# Patient Record
Sex: Male | Born: 1965 | Race: Black or African American | Hispanic: No | Marital: Single | Smoking: Never smoker
Health system: Southern US, Community
[De-identification: ages and names within clinical notes are randomized; demographics above are authoritative.]

## PROBLEM LIST (undated history)

## (undated) DIAGNOSIS — N4400 Torsion of testis, unspecified: Secondary | ICD-10-CM

## (undated) DIAGNOSIS — T7840XA Allergy, unspecified, initial encounter: Secondary | ICD-10-CM

## (undated) HISTORY — PX: KNEE SURGERY: SHX244

## (undated) HISTORY — PX: SHOULDER SURGERY: SHX246

## (undated) HISTORY — DX: Allergy, unspecified, initial encounter: T78.40XA

## (undated) HISTORY — DX: Torsion of testis, unspecified: N44.00

---

## 1999-08-25 ENCOUNTER — Emergency Department (HOSPITAL_COMMUNITY): Admission: EM | Admit: 1999-08-25 | Discharge: 1999-08-25 | Payer: Self-pay | Admitting: Podiatry

## 2002-09-16 ENCOUNTER — Emergency Department (HOSPITAL_COMMUNITY): Admission: EM | Admit: 2002-09-16 | Discharge: 2002-09-16 | Payer: Self-pay | Admitting: Emergency Medicine

## 2002-09-28 ENCOUNTER — Emergency Department (HOSPITAL_COMMUNITY): Admission: EM | Admit: 2002-09-28 | Discharge: 2002-09-28 | Payer: Self-pay | Admitting: Emergency Medicine

## 2007-04-14 ENCOUNTER — Encounter: Admission: RE | Admit: 2007-04-14 | Discharge: 2007-04-14 | Payer: Self-pay | Admitting: Occupational Medicine

## 2008-10-27 ENCOUNTER — Inpatient Hospital Stay (HOSPITAL_COMMUNITY): Admission: EM | Admit: 2008-10-27 | Discharge: 2008-10-29 | Payer: Self-pay | Admitting: Emergency Medicine

## 2008-10-28 ENCOUNTER — Encounter (INDEPENDENT_AMBULATORY_CARE_PROVIDER_SITE_OTHER): Payer: Self-pay | Admitting: *Deleted

## 2010-03-19 ENCOUNTER — Emergency Department (HOSPITAL_COMMUNITY): Admission: EM | Admit: 2010-03-19 | Discharge: 2010-03-19 | Payer: Self-pay | Admitting: Emergency Medicine

## 2011-01-08 LAB — URINALYSIS, ROUTINE W REFLEX MICROSCOPIC
Bilirubin Urine: NEGATIVE
Glucose, UA: NEGATIVE mg/dL
Hgb urine dipstick: NEGATIVE
Ketones, ur: NEGATIVE mg/dL
Nitrite: NEGATIVE
Protein, ur: NEGATIVE mg/dL
Specific Gravity, Urine: 1.044 — ABNORMAL HIGH (ref 1.005–1.030)
Urobilinogen, UA: 1 mg/dL (ref 0.0–1.0)
pH: 7.5 (ref 5.0–8.0)

## 2011-01-08 LAB — BASIC METABOLIC PANEL
BUN: 11 mg/dL (ref 6–23)
CO2: 26 mEq/L (ref 19–32)
Calcium: 8.6 mg/dL (ref 8.4–10.5)
Chloride: 104 mEq/L (ref 96–112)
Creatinine, Ser: 0.89 mg/dL (ref 0.4–1.5)
GFR calc Af Amer: >60 mL/min (ref >60)
GFR calc non Af Amer: >60 mL/min (ref >60)
Glucose, Bld: 88 mg/dL (ref 70–99)
Potassium: 3.9 mEq/L (ref 3.5–5.1)
Sodium: 136 mEq/L (ref 135–145)

## 2011-01-08 LAB — BRAIN NATRIURETIC PEPTIDE: Pro B Natriuretic peptide (BNP): <30 pg/mL (ref 0.0–100.0)

## 2011-01-08 LAB — CBC
HCT: 44.8 % (ref 39.0–52.0)
HCT: 44.6 % (ref 39.0–52.0)
Hemoglobin: 15.4 g/dL (ref 13.0–17.0)
Hemoglobin: 15.5 g/dL (ref 13.0–17.0)
MCHC: 34.4 g/dL (ref 30.0–36.0)
MCHC: 34.7 g/dL (ref 30.0–36.0)
MCV: 91.6 fL (ref 78.0–100.0)
MCV: 91.7 fL (ref 78.0–100.0)
Platelets: 197 10*3/uL (ref 150–400)
Platelets: 186 10*3/uL (ref 150–400)
RBC: 4.9 MIL/uL (ref 4.22–5.81)
RBC: 4.87 MIL/uL (ref 4.22–5.81)
RDW: 14.4 % (ref 11.5–15.5)
RDW: 13.4 % (ref 11.5–15.5)
WBC: 4.3 10*3/uL (ref 4.0–10.5)
WBC: 5.1 10*3/uL (ref 4.0–10.5)

## 2011-01-08 LAB — DIFFERENTIAL
Basophils Absolute: 0 10*3/uL (ref 0.0–0.1)
Basophils Relative: 0 % (ref 0–1)
Eosinophils Absolute: 0.1 10*3/uL (ref 0.0–0.7)
Eosinophils Relative: 2 % (ref 0–5)
Lymphocytes Relative: 47 % — ABNORMAL HIGH (ref 12–46)
Lymphs Abs: 2 10*3/uL (ref 0.7–4.0)
Monocytes Absolute: 0.4 10*3/uL (ref 0.1–1.0)
Monocytes Relative: 9 % (ref 3–12)
Neutro Abs: 1.8 10*3/uL (ref 1.7–7.7)
Neutrophils Relative %: 42 % — ABNORMAL LOW (ref 43–77)

## 2011-01-08 LAB — POCT I-STAT 3, ART BLOOD GAS (G3+)
Acid-base deficit: 1 mmol/L (ref 0.0–2.0)
Bicarbonate: 24.9 mEq/L — ABNORMAL HIGH (ref 20.0–24.0)
O2 Saturation: 96 %
TCO2: 26 mmol/L (ref 0–100)
pCO2 arterial: 44.2 mmHg (ref 35.0–45.0)
pH, Arterial: 7.358 (ref 7.350–7.450)
pO2, Arterial: 87 mmHg (ref 80.0–100.0)

## 2011-01-08 LAB — DRUGS OF ABUSE SCREEN W/O ALC, ROUTINE URINE
Amphetamine Screen, Ur: NEGATIVE
Barbiturate Quant, Ur: NEGATIVE
Benzodiazepines.: NEGATIVE
Cocaine Metabolites: NEGATIVE
Creatinine,U: 196.1 mg/dL
Marijuana Metabolite: NEGATIVE
Methadone: NEGATIVE
Opiate Screen, Urine: NEGATIVE
Phencyclidine (PCP): NEGATIVE
Propoxyphene: NEGATIVE

## 2011-01-08 LAB — CK TOTAL AND CKMB (NOT AT ARMC)
CK, MB: 2.4 ng/mL (ref 0.3–4.0)
CK, MB: 2.1 ng/mL (ref 0.3–4.0)
Relative Index: 1.1 (ref 0.0–2.5)
Relative Index: 1.2 (ref 0.0–2.5)
Total CK: 214 U/L (ref 7–232)
Total CK: 169 U/L (ref 7–232)

## 2011-01-08 LAB — COMPREHENSIVE METABOLIC PANEL
ALT: 31 U/L (ref 0–53)
AST: 31 U/L (ref 0–37)
Albumin: 3.7 g/dL (ref 3.5–5.2)
Alkaline Phosphatase: 50 U/L (ref 39–117)
BUN: 10 mg/dL (ref 6–23)
CO2: 24 mEq/L (ref 19–32)
Calcium: 8.9 mg/dL (ref 8.4–10.5)
Chloride: 104 mEq/L (ref 96–112)
Creatinine, Ser: 0.81 mg/dL (ref 0.4–1.5)
GFR calc Af Amer: >60 mL/min (ref >60)
GFR calc non Af Amer: >60 mL/min (ref >60)
Glucose, Bld: 95 mg/dL (ref 70–99)
Potassium: 3.3 mEq/L — ABNORMAL LOW (ref 3.5–5.1)
Sodium: 134 mEq/L — ABNORMAL LOW (ref 135–145)
Total Bilirubin: 1.1 mg/dL (ref 0.3–1.2)
Total Protein: 6.8 g/dL (ref 6.0–8.3)

## 2011-01-08 LAB — POCT I-STAT 3, VENOUS BLOOD GAS (G3P V)
Acid-base deficit: 4 mmol/L — ABNORMAL HIGH (ref 0.0–2.0)
Bicarbonate: 23.1 mEq/L (ref 20.0–24.0)
O2 Saturation: 69 %
TCO2: 25 mmol/L (ref 0–100)
pCO2, Ven: 48.4 mmHg (ref 45.0–50.0)
pH, Ven: 7.286 (ref 7.250–7.300)
pO2, Ven: 41 mmHg (ref 30.0–45.0)

## 2011-01-08 LAB — MAGNESIUM: Magnesium: 2 mg/dL (ref 1.5–2.5)

## 2011-01-08 LAB — POCT I-STAT, CHEM 8
BUN: 14 mg/dL (ref 6–23)
Calcium, Ion: 1.24 mmol/L (ref 1.12–1.32)
Chloride: 104 mEq/L (ref 96–112)
Creatinine, Ser: 0.9 mg/dL (ref 0.4–1.5)
Glucose, Bld: 88 mg/dL (ref 70–99)
HCT: 45 % (ref 39.0–52.0)
Hemoglobin: 15.3 g/dL (ref 13.0–17.0)
Potassium: 4.1 mEq/L (ref 3.5–5.1)
Sodium: 141 mEq/L (ref 135–145)
TCO2: 27 mmol/L (ref 0–100)

## 2011-01-08 LAB — CULTURE, BLOOD (ROUTINE X 2)
Culture: NO GROWTH
Culture: NO GROWTH

## 2011-01-08 LAB — POCT CARDIAC MARKERS
CKMB, poc: 1.1 ng/mL (ref 1.0–8.0)
CKMB, poc: 2.4 ng/mL (ref 1.0–8.0)
Myoglobin, poc: 47.2 ng/mL (ref 12–200)
Myoglobin, poc: 75 ng/mL (ref 12–200)
Troponin i, poc: <0.05 ng/mL (ref 0.00–0.09)
Troponin i, poc: <0.05 ng/mL (ref 0.00–0.09)

## 2011-01-08 LAB — URINE CULTURE
Colony Count: NO GROWTH
Culture: NO GROWTH

## 2011-01-08 LAB — TROPONIN I
Troponin I: 0.09 ng/mL — ABNORMAL HIGH (ref 0.00–0.06)
Troponin I: 0.09 ng/mL — ABNORMAL HIGH (ref 0.00–0.06)
Troponin I: 0.1 ng/mL — ABNORMAL HIGH (ref 0.00–0.06)

## 2011-01-08 LAB — HEMOGLOBIN A1C
Hgb A1c MFr Bld: 5.2 % (ref 4.6–6.1)
Mean Plasma Glucose: 103 mg/dL

## 2011-01-08 LAB — PROTIME-INR
INR: 1.1 (ref 0.00–1.49)
Prothrombin Time: 14.1 seconds (ref 11.6–15.2)

## 2011-01-08 LAB — LIPID PANEL
Cholesterol: 131 mg/dL (ref 0–200)
HDL: 37 mg/dL — ABNORMAL LOW (ref >39)
LDL Cholesterol: 82 mg/dL (ref 0–99)
Total CHOL/HDL Ratio: 3.5 RATIO
Triglycerides: 60 mg/dL (ref <150)
VLDL: 12 mg/dL (ref 0–40)

## 2011-01-08 LAB — APTT: aPTT: 33 seconds (ref 24–37)

## 2011-01-08 LAB — HOMOCYSTEINE: Homocysteine: 7.4 umol/L (ref 4.0–15.4)

## 2011-01-08 LAB — TSH: TSH: 1.977 u[IU]/mL (ref 0.350–4.500)

## 2011-01-08 LAB — PHOSPHORUS: Phosphorus: 3.1 mg/dL (ref 2.3–4.6)

## 2011-02-05 NOTE — Discharge Summary (Signed)
Andrew Jacobs, Andrew Jacobs          ACCOUNT NO.:  [id number]   MEDICAL RECORD NO.:  [id number]          PATIENT TYPE:  INP   LOCATION:  2006                         FACILITY:  [facility]   PHYSICIAN:  Monte Fantasia, MD  DATE OF BIRTH:  1966/04/05   DATE OF ADMISSION:  10/27/2008  DATE OF DISCHARGE:  10/29/2008                               DISCHARGE SUMMARY   DISCHARGE DIAGNOSES:  1. Precordial chest pain.  2. Cardiomyopathy.  3. Status post cardiac catheterization.  4. Atypical pneumonia.   MEDICATIONS ON DISCHARGE:  1. Aspirin 325 mg p.o. daily.  2. Vasotec 5 mg p.o. daily.  3. Zithromax 500 mg p.o. daily for 2 days.  4. Plavix 75 mg p.o. daily.  5. Protonix 40 mg p.o. q.12 h.   Cardiology consultation with Dr. Algie Coffer.   PROCEDURES DONE DURING THE STAY IN THE HOSPITAL:  Cardiac  catheterization, done on October 28, 2008.   IMPRESSION:  Normal coronaries with mild to moderate left ventricular  systolic dysfunction.   COURSE DURING THE HOSPITAL STAY:  A 45 year old African American male  patient was admitted on October 27, 2008, with complaints of chest pain,  which lasted for 20-30 minutes.  The patient states that the chest pain  was felt in the substernal region, more like tightness of chest but not  associated with nausea, vomiting, abdominal pain, dizziness, or  diaphoresis.  The patient had cardiac enzymes done, which were positive  with troponins of 0.09 and 0.1.  The patient had a Cardiology evaluation  regarding the same with Dr. Algie Coffer.  The patient had a 2-D echo done,  which showed mild to moderate left LV systolic dysfunction with an EF of  40%.  The patient underwent a coronary artery catheterization.  The patient denies any chest complaints at present, and as per  Cardiology, also the patient is stable to be discharged.  The patient is  recommended to follow up with Dr. Algie Coffer in the next few weeks, with in  1 or 2 weeks.   RADIOLOGICAL INVESTIGATIONS  DONE DURING THE STAY IN THE HOSPITAL:  1. Chest x-ray done on October 27, 2008.  Impression:  Left lower lobe infiltrate, likely due to the pneumonia.  1. CT angio of the chest, done on October 27, 2008.  Impression:  Unremarkable examination.  No evidence of pulmonary emboli  or thoracic aortic aneurysm.   LABORATORY DATA UPON DISCHARGE:  Total WBC 5.1, hemoglobin 15.5,  hematocrit 44.6, platelets of 186.  Sodium 136, potassium 3.9, chloride  104, bicarb 26, glucose 88, BUN 11, creatinine 0.89, calcium of 8.6.  Cardiac enzymes, troponin x3 sets were 0.09, 0.09, and 0.1.  Total  cholesterol 131, triglycerides 60, HDL 37, LDL 82.  Homocysteine level  was 7.4.  UA has been negative.  Blood cultures and urine cultures have  been no growth to date.   DISPOSITION:  The patient is stable to be discharged home, is counseled  to avoid lifting heavy weights, and recommended mild activity and  gradually increase in his activity and also recommended to report to the  ER in case of any further chest pains or  diaphoresis.  The patient is  recommended also to follow up with Dr. Algie Coffer in the next 1 or 2 weeks.  The patient is stable to be discharged at present and can be discharged  home.   Primary care physician is unassigned.      Monte Fantasia, MD  Electronically Signed     MP/MEDQ  D:  10/29/2008  T:  10/29/2008  Job:  [id number]   cc:   Ricki Rodriguez, M.D.

## 2011-02-05 NOTE — H&P (Signed)
Andrew Jacobs, Andrew Jacobs          ACCOUNT NO.:  [id number]   MEDICAL RECORD NO.:  [id number]          PATIENT TYPE:  INP   LOCATION:  2006                         FACILITY:  [facility]   PHYSICIAN:  Carlena Hurl, MDDATE OF BIRTH:  October 18, 1965   DATE OF ADMISSION:  10/27/2008  DATE OF DISCHARGE:                              HISTORY & PHYSICAL   CHIEF COMPLAINT:  Chest pain of 1-day duration.   HISTORY OF PRESENT ILLNESS:  This is a 45 year old African American  gentleman who has no past medical history, coming to the ER with a 1-day  history of chest pain.  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  He states that he  went to the work this morning, and on the afternoon, he was sitting on  his desk and noticed chest pain in the substernal region which he felt  like a tightness in the chest, but no associated nausea, no vomiting, no  abdominal pain, no dizziness, no diaphoresis.  He states that he had  that chest pain for about 20 to 30 minutes, and after that the patient  walked around in the office and he still had little bit of chest pain  and later he took some rest, and finally after sometime, the chest pain  resolved as his colleagues wanted him to come the ER, the patient  decided to go to the ER.  So, after the patient came to the ER, he was  given aspirin and nitroglycerin.  He states that his chest pain resolved  and he had EKG done in the ER, which is normal and with normal sinus  rhythm.   PAST MEDICAL HISTORY:  Nil.   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   MEDICATIONS:  None.   FAMILY HISTORY:  Nothing significant.   ALLERGIES:  No known drug allergies.   SOCIAL HISTORY:  The patient lives with his wife at home  and he denies  smoking, alcohol, IV drug abuse and he is a Emergency planning/management officer works in  [employer] for the past 18 years.   REVIEW OF SYSTEM:  Other than that was in history of present illness,  nothing significant.   PHYSICAL EXAMINATION:  GENERAL:  This is a general 45 year old African  American gentleman who is lying comfortably on the bed without any  severe chest pain or shortness of breath.  VITALS:  His blood pressure when he came into the ER are 123/90, pulse  rate of 62, respirations 16, later his blood pressure came down to  118/65, pulse rate of 54, respirations 14, temperature 98.1.  HEENT:  Head is atraumatic, normocephalic.  Pupils, PERRLA.  Tympanic  membrane intact.  No discharge from eyes or ears.  NECK:  Supple.  No JVD noted and no goiter appreciated.  LUNGS:  Clear to auscultation bilaterally.  CVS:  S1 and S2  heard with a regular rate and rhythm.  ABDOMEN:  Soft.  Bowel sounds present.  Nontender.  Nondistended.  EXTREMITIES:  No pedal edema noted.  Pulses are palpable bilaterally.   LABORATORY DATA:  Labs for this patient revealed hemoglobin of 15.3,  hematocrit 45.0.  Sodium 141, potassium 4.1, chloride 104, bicarb of 27,  calcium of 1.24, BUN 14, creatinine 0.9.  First set of CK-MB 2.4, second  set 1.1.  First set of troponin I less than 0.05, second set less than  0.05.  Myoglobin is 47.2; first set and second set is 75.0.  EKG showed  normal sinus rhythm, and the patient had chest x-ray which showed left  lower lobe infiltrate likely pneumonia and CT chest was done, which is  negative for any pulmonary embolism and lungs are clear with no  infiltrate.   ASSESSMENT AND PLAN:  This is a 45 year old African American gentleman  who works as a Emergency planning/management officer now coming in with 1-day history of  substernal chest pain, no associated nausea, vomiting, diaphoresis.  1. 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      We will admit him to rule out  cardiac chest pain.  So far his EKG      is normal and two sets of cardiac enzymes are negative.  So, once      the patient is clinically stable and if the third set of enzyme is      also negative and there is no worsening of his chest pain, the      patient can be discharged home to get his stress test as an      outpatient and we will also check his lipid profile.  2. Deep vein thrombosis prophylaxis.  The patient will be started on      Lovenox 40 mg subcu 1 time a day.   DIET:  The patient will be given regular diet as tolerated.   DISPOSITION:  To home, once the patient is clinically stable.      Carlena Hurl, MD  Electronically Signed     JD/MEDQ  D:  10/27/2008  T:  10/28/2008  Job:  [id number]

## 2012-02-21 ENCOUNTER — Other Ambulatory Visit (HOSPITAL_COMMUNITY): Payer: Self-pay | Admitting: Family Medicine

## 2012-02-21 ENCOUNTER — Ambulatory Visit (HOSPITAL_COMMUNITY)
Admission: RE | Admit: 2012-02-21 | Discharge: 2012-02-21 | Disposition: A | Discharge status: Home / Self Care | Payer: Self-pay | Source: Ambulatory Visit | Attending: Family Medicine | Admitting: Family Medicine

## 2012-02-21 DIAGNOSIS — R51 Headache: Secondary | ICD-10-CM | POA: Insufficient documentation

## 2012-02-21 DIAGNOSIS — G8929 Other chronic pain: Secondary | ICD-10-CM

## 2012-12-17 ENCOUNTER — Other Ambulatory Visit: Payer: Self-pay | Admitting: Neurology

## 2012-12-17 DIAGNOSIS — T1490XA Injury, unspecified, initial encounter: Secondary | ICD-10-CM

## 2012-12-17 DIAGNOSIS — R519 Headache, unspecified: Secondary | ICD-10-CM

## 2012-12-23 ENCOUNTER — Ambulatory Visit
Admission: RE | Admit: 2012-12-23 | Discharge: 2012-12-23 | Disposition: A | Discharge status: Home / Self Care | Payer: Self-pay | Source: Ambulatory Visit | Attending: Neurology | Admitting: Neurology

## 2012-12-23 DIAGNOSIS — R519 Headache, unspecified: Secondary | ICD-10-CM

## 2012-12-23 DIAGNOSIS — T1490XA Injury, unspecified, initial encounter: Secondary | ICD-10-CM

## 2012-12-23 MED ORDER — GADOBENATE DIMEGLUMINE 529 MG/ML IV SOLN
18.0000 mL | Freq: Once | INTRAVENOUS | Status: AC | PRN
  Administered 2012-12-23: 18 mL via INTRAVENOUS

## 2015-01-05 ENCOUNTER — Ambulatory Visit (INDEPENDENT_AMBULATORY_CARE_PROVIDER_SITE_OTHER): Payer: 59 | Admitting: Physician Assistant

## 2015-01-05 VITALS — BP 104/72 | HR 73 | Temp 98.2°F | Resp 12 | Ht 71.25 in | Wt 191.0 lb

## 2015-01-05 DIAGNOSIS — J302 Other seasonal allergic rhinitis: Secondary | ICD-10-CM

## 2015-01-05 DIAGNOSIS — R05 Cough: Secondary | ICD-10-CM | POA: Diagnosis not present

## 2015-01-05 DIAGNOSIS — R059 Cough, unspecified: Secondary | ICD-10-CM

## 2015-01-05 MED ORDER — HYDROCODONE-HOMATROPINE 5-1.5 MG/5ML PO SYRP
5.0000 mL | ORAL_SOLUTION | Freq: Three times a day (TID) | ORAL | Status: DC | PRN
Start: 2015-01-05 — End: 2015-10-09

## 2015-01-05 MED ORDER — LORATADINE-PSEUDOEPHEDRINE ER 10-240 MG PO TB24: 1.0000 | ORAL_TABLET | Freq: Every day | ORAL | Status: DC

## 2015-01-05 NOTE — Progress Notes (Signed)
01/05/2015 at 4:09 PM  Andrew Jacobs / DOB: May 21, 1966 / MRN: [id number]  The patient  does not have a problem list on file.  SUBJECTIVE  Chief complaint: Allergies; Headache; and Cough  HPI  Andrew Jacobs is a well appearing 49 y.o. male here today for the evaluation of seasonal allergies.  He reports sneezing, nasal itching, eye irritation and mild chest congestion for 7 days now. He reports night time cough that has been keeping him awake at night. He has been taking Claritin for his symptoms with mild relief. He has tried using flonase once through this exacerbation.   He feels his allergies have been getting worse as he ages, and has a difficult time controlling them.  Going outside makes his symptoms so bad that he can no longer enjoy outdoor activities.   He  has a past medical history of Allergy.    He currently has no medications in their medication list.  Andrew Jacobs has No Known Allergies. He  reports that he has never smoked. He has never used smokeless tobacco. He  has no sexual activity history on file. The patient  has past surgical history that includes Knee surgery and Shoulder surgery.  His family history is not on file.  Review of Systems  Constitutional: Negative for fever and chills.  HENT: Negative for hearing loss and sore throat.   Eyes: Negative for double vision and photophobia.  Respiratory: Positive for cough. Negative for sputum production and shortness of breath.   Gastrointestinal: Negative for nausea and abdominal pain.  Skin: Negative for rash.  Neurological: Negative for dizziness.    OBJECTIVE  His  height is 5' 11.25" (1.81 m) and weight is 191 lb (86.637 kg). His oral temperature is 98.2 F (36.8 C). His blood pressure is 104/72 and his pulse is 73. His respiration is 12 and oxygen saturation is 97%.  The patient's body mass index is 26.45 kg/(m^2).  Physical Exam  Constitutional: He is oriented to person, place, and time. He  appears well-developed and well-nourished. No distress.  HENT:  Right Ear: Hearing, tympanic membrane, external ear and ear canal normal.  Left Ear: Hearing, tympanic membrane, external ear and ear canal normal.  Nose: Mucosal edema (bluish hue) present. No sinus tenderness. Right sinus exhibits no maxillary sinus tenderness and no frontal sinus tenderness. Left sinus exhibits no maxillary sinus tenderness and no frontal sinus tenderness.  Mouth/Throat: Uvula is midline and mucous membranes are normal. Mucous membranes are not pale, not dry and not cyanotic. No oropharyngeal exudate, posterior oropharyngeal edema, posterior oropharyngeal erythema or tonsillar abscesses.  Cardiovascular: Normal rate and regular rhythm.   Respiratory: Effort normal and breath sounds normal. He has no wheezes. He has no rales.  Musculoskeletal: Normal range of motion.  Lymphadenopathy:    He has no cervical adenopathy.  Neurological: He is alert and oriented to person, place, and time.  Skin: Skin is warm and dry. He is not diaphoretic.  Psychiatric: He has a normal mood and affect.    No results found for this or any previous visit (from the past 24 hour(s)).  ASSESSMENT & PLAN  Andrew Jacobs was seen today for allergies, headache and cough.  Diagnoses and all orders for this visit:  Seasonal allergies: Advised patient via avs to start flonase today along with the below, and once his symptoms are controlled to start zyrtec daily. Advised that if this regimen fails to control his symptoms then to call back and I will  write for a steroid taper.  Orders: -     loratadine-pseudoephedrine (CLARITIN-D 24 HOUR) 10-240 MG per 24 hr tablet; Take 1 tablet by mouth daily. -     Ambulatory referral to Allergy  Cough Orders: -     HYDROcodone-homatropine (HYCODAN) 5-1.5 MG/5ML syrup; Take 5 mLs by mouth every 8 (eight) hours as needed for cough.   The patient was advised to call or come back to clinic if he does not see  an improvement in symptoms, or worsens with the above plan.   Andrew Jacobs, MHS, PA-C [facility] 01/05/2015 4:09 PM

## 2015-01-05 NOTE — Patient Instructions (Signed)
Take Zyrtec and Flonase for seasonal allergies in the future.  Add Claritin D to this regimen if your symptoms are not controlled with this combination.

## 2015-03-16 ENCOUNTER — Institutional Professional Consult (permissible substitution): Payer: Self-pay | Admitting: Internal Medicine

## 2015-10-06 ENCOUNTER — Telehealth: Payer: Self-pay

## 2015-10-06 NOTE — Telephone Encounter (Signed)
Message for Dr. Cleta Albertsaub, pt has paperwork from [employer] that he would like to talk about  Please advise  [phone]

## 2015-10-09 ENCOUNTER — Ambulatory Visit (INDEPENDENT_AMBULATORY_CARE_PROVIDER_SITE_OTHER): Payer: Commercial Managed Care - HMO | Admitting: Family

## 2015-10-09 ENCOUNTER — Encounter: Payer: Self-pay | Admitting: Family

## 2015-10-09 VITALS — BP 144/94 | HR 80 | Temp 98.5°F | Resp 18 | Ht 71.25 in | Wt 204.4 lb

## 2015-10-09 DIAGNOSIS — N5239 Other post-surgical erectile dysfunction: Secondary | ICD-10-CM | POA: Diagnosis not present

## 2015-10-09 DIAGNOSIS — R32 Unspecified urinary incontinence: Secondary | ICD-10-CM

## 2015-10-09 DIAGNOSIS — N529 Male erectile dysfunction, unspecified: Secondary | ICD-10-CM | POA: Insufficient documentation

## 2015-10-09 NOTE — Progress Notes (Signed)
Subjective:    Patient ID: Andrew Jacobs, male    DOB: [birth year], 50 y.o.   MRN: [id number]  Chief Complaint  Patient presents with  . Establish Care    had surgery when he was in the [employer] and has paper work concerning it    HPI:  Andrew Jacobs is a 50 y.o. male who  has a past medical history of Allergy and Testicular torsion. and presents today for an office visit to establish care.  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 He continued to have residual problems including pain which complicated strenuous activity. He experienced a second episode of the testicular torsion while he was seated at his home experiencing a siginifcant amount of pain in January 1994. 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 Since the second time he occasionally will have symptoms of discomfort. He has also experienced urinary incontinence and erectile dysfunction since the initial episode of the testicular torsion and continues today and has been going on for nearly 25 years. Modifying factors include urinary incontinence pads which only help to absorb but do not improve the symptoms. Severity is enough that he goes to bathroom greater than 5 times at night and practically hourly. He is currently awaiting follow up with urology for this. 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 He is also requesting paperwork to be filled in for the [employer].   No Known Allergies   Outpatient Prescriptions Prior to Visit  Medication Sig Dispense Refill  . HYDROcodone-homatropine (HYCODAN) 5-1.5 MG/5ML syrup Take 5 mLs by mouth every 8 (eight) hours as needed for cough. 120 mL 0  . loratadine-pseudoephedrine (CLARITIN-D 24 HOUR) 10-240 MG per 24 hr tablet Take 1 tablet by mouth daily. 14 tablet 0   No facility-administered  medications prior to visit.     Past Medical History  Diagnosis Date  . Allergy   . Testicular torsion      Past Surgical History  Procedure Laterality Date  . Knee surgery    . Shoulder surgery       Family History  Problem Relation Age of Onset  . Healthy Mother   . Healthy Father   . Healthy Maternal Grandmother   . Healthy Maternal Grandfather   . Healthy Paternal Grandmother   . Healthy Paternal Grandfather      Social History   Social History  . Marital Status: Single    Spouse Name: N/A  . Number of Children: 2  . Years of Education: 18   Occupational History  . Emergency planning/management officer     Social History Main Topics  . Smoking status: Never Smoker   . Smokeless tobacco: Never Used  . Alcohol Use: No  . Drug Use: No  . Sexual Activity: Not on file   Other Topics Concern  . Not on file   Social History Narrative      Review of Systems  Constitutional: Negative for fever and chills.  Genitourinary: Positive for frequency. Negative for dysuria, urgency, hematuria, flank pain, scrotal swelling, penile pain and testicular pain.      Objective:    BP 144/94 mmHg  Pulse 80  Temp(Src) 98.5 F (36.9 C) (Oral)  Resp 18  Ht 5' 11.25" (1.81 m)  Wt 204 lb 6.4 oz (92.715 kg)  BMI 28.30 kg/m2  SpO2 97% Nursing note and vital signs reviewed.  Physical Exam  Constitutional: He is oriented to person, place, and time. He appears well-developed and well-nourished. No distress.  Cardiovascular: Normal rate, regular rhythm, normal heart sounds and intact distal pulses.   Pulmonary/Chest: Effort normal and breath sounds normal.  Genitourinary: Penis normal.    Right testis shows tenderness. Right testis shows no mass and no swelling. Right testis is descended. No penile erythema or penile tenderness. No discharge found.  Neurological: He is alert and oriented to person, place, and time.  Skin: Skin is warm and dry.  Psychiatric: He has a normal mood and  affect. His behavior is normal. Judgment and thought content normal.       Assessment & Plan:   Problem List Items Addressed This Visit      Genitourinary   Erectile dysfunction    Inability to obtain and maintain an erection without medication secondary to previous testicular torsion which most likely effected testosterone production. Declines medication at this time and has follow up with urology.         Other   Urinary incontinence - Primary    Symptoms of urinary incontinence started approximately 1 week following his first surgery for testicular torsion and continues to experience symptoms and occasional urinary tract infections. Currently uses Depends and other pads. Has follow up with urology for further management. Continue current treatment pending urology follow up.

## 2015-10-09 NOTE — Progress Notes (Signed)
Pre visit review using our clinic review tool, if applicable. No additional management support is needed unless otherwise documented below in the visit note. 

## 2015-10-09 NOTE — Patient Instructions (Signed)
Thank you for choosing [facility].  Summary/Instructions:  Your prescription(s) have been submitted to your pharmacy or been printed and provided for you. Please take as directed and contact our office if you believe you are having problem(s) with the medication(s) or have any questions.  If your symptoms worsen or fail to improve, please contact our office for further instruction, or in case of emergency go directly to the emergency room at the closest medical facility.   Urinary Incontinence Urinary incontinence is the involuntary loss of urine from your bladder. CAUSES  There are many causes of urinary incontinence. They include:  Medicines.  Infections.  Prostatic enlargement, leading to overflow of urine from your bladder.  Surgery.  Neurological diseases.  Emotional factors. SIGNS AND SYMPTOMS Urinary Incontinence can be divided into four types: 1. Urge incontinence. Urge incontinence is the involuntary loss of urine before you have the opportunity to go to the bathroom. There is a sudden urge to void but not enough time to reach a bathroom. 2. Stress incontinence. Stress incontinence is the sudden loss of urine with any activity that forces urine to pass. It is commonly caused by anatomical changes to the pelvis and sphincter areas of your body. 3. Overflow incontinence. Overflow incontinence is the loss of urine from an obstructed opening to your bladder. This results in a backup of urine and a resultant buildup of pressure within the bladder. When the pressure within the bladder exceeds the closing pressure of the sphincter, the urine overflows, which causes incontinence, similar to water overflowing a dam. 4. Total incontinence. Total incontinence is the loss of urine as a result of the inability to store urine within your bladder. DIAGNOSIS  Evaluating the cause of incontinence may require:  A thorough and complete medical and obstetric history.  A complete  physical exam.  Laboratory tests such as a urine culture and sensitivities. When additional tests are indicated, they can include:  An ultrasound exam.  Kidney and bladder X-rays.  Cystoscopy. This is an exam of the bladder using a narrow scope.  Urodynamic testing to test the nerve function to the bladder and sphincter areas. TREATMENT  Treatment for urinary incontinence depends on the cause:  For urge incontinence caused by a bacterial infection, antibiotics will be prescribed. If the urge incontinence is related to medicines you take, your health care provider may have you change the medicine.  For stress incontinence, surgery to re-establish anatomical support to the bladder or sphincter, or both, will often correct the condition.  For overflow incontinence caused by an enlarged prostate, an operation to open the channel through the enlarged prostate will allow the flow of urine out of the bladder. In women with fibroids, a hysterectomy may be recommended.  For total incontinence, surgery on your urinary sphincter may help. An artificial urinary sphincter (an inflatable cuff placed around the urethra) may be required. In women who have developed a hole-like passage between their bladder and vagina (vesicovaginal fistula), surgery to close the fistula often is required. HOME CARE INSTRUCTIONS  Normal daily hygiene and the use of pads or adult diapers that are changed regularly will help prevent odors and skin damage.  Avoid caffeine. It can overstimulate your bladder.  Use the bathroom regularly. Try about every 2-3 hours to go to the bathroom, even if you do not feel the need to do so. Take time to empty your bladder completely. After urinating, wait a minute. Then try to urinate again.  For causes involving nerve dysfunction,  keep a log of the medicines you take and a journal of the times you go to the bathroom. SEEK MEDICAL CARE IF:  You experience worsening of pain instead of  improvement in pain after your procedure.  Your incontinence becomes worse instead of better. SEE IMMEDIATE MEDICAL CARE IF:  You experience fever or shaking chills.  You are unable to pass your urine.  You have redness spreading into your groin or down into your thighs. MAKE SURE YOU:   Understand these instructions.   Will watch your condition.  Will get help right away if you are not doing well or get worse.   This information is not intended to replace advice given to you by your health care provider. Make sure you discuss any questions you have with your health care provider.   Document Released: 10/17/2004 Document Revised: 09/30/2014 Document Reviewed: 02/16/2013 Elsevier Interactive Patient Education [employer]2016 Elsevier Inc.

## 2015-10-09 NOTE — Assessment & Plan Note (Signed)
Symptoms of urinary incontinence started approximately 1 week following his first surgery for testicular torsion and continues to experience symptoms and occasional urinary tract infections. Currently uses Depends and other pads. Has follow up with urology for further management. Continue current treatment pending urology follow up.

## 2015-10-09 NOTE — Assessment & Plan Note (Signed)
Inability to obtain and maintain an erection without medication secondary to previous testicular torsion which most likely effected testosterone production. Declines medication at this time and has follow up with urology.

## 2015-10-10 NOTE — Telephone Encounter (Signed)
Left VM

## 2015-10-10 NOTE — Telephone Encounter (Signed)
He  can call me tonight on my cell [phone]. I will be happy to try and answer his questions.

## 2016-03-12 ENCOUNTER — Encounter (HOSPITAL_COMMUNITY): Payer: Self-pay | Admitting: Emergency Medicine

## 2016-03-12 ENCOUNTER — Emergency Department (HOSPITAL_COMMUNITY)
Admission: EM | Admit: 2016-03-12 | Discharge: 2016-03-12 | Disposition: A | Discharge status: Home / Self Care | Payer: Commercial Managed Care - HMO | Attending: Emergency Medicine | Admitting: Emergency Medicine

## 2016-03-12 ENCOUNTER — Emergency Department (HOSPITAL_COMMUNITY): Payer: Commercial Managed Care - HMO

## 2016-03-12 DIAGNOSIS — S161XXA Strain of muscle, fascia and tendon at neck level, initial encounter: Secondary | ICD-10-CM | POA: Diagnosis not present

## 2016-03-12 DIAGNOSIS — Y92410 Unspecified street and highway as the place of occurrence of the external cause: Secondary | ICD-10-CM | POA: Diagnosis not present

## 2016-03-12 DIAGNOSIS — Y999 Unspecified external cause status: Secondary | ICD-10-CM | POA: Insufficient documentation

## 2016-03-12 DIAGNOSIS — Y939 Activity, unspecified: Secondary | ICD-10-CM | POA: Diagnosis not present

## 2016-03-12 DIAGNOSIS — S199XXA Unspecified injury of neck, initial encounter: Secondary | ICD-10-CM | POA: Diagnosis present

## 2016-03-12 MED ORDER — HYDROCODONE-ACETAMINOPHEN 5-325 MG PO TABS: ORAL_TABLET | ORAL | Status: DC

## 2016-03-12 MED ORDER — ACETAMINOPHEN 325 MG PO TABS
975.0000 mg | ORAL_TABLET | Freq: Once | ORAL | Status: AC
  Administered 2016-03-12: 975 mg via ORAL
  Filled 2016-03-12: qty 3
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
## 2016-09-27 ENCOUNTER — Ambulatory Visit (INDEPENDENT_AMBULATORY_CARE_PROVIDER_SITE_OTHER): Payer: 59 | Admitting: Physician Assistant

## 2016-09-27 ENCOUNTER — Ambulatory Visit (INDEPENDENT_AMBULATORY_CARE_PROVIDER_SITE_OTHER): Payer: 59

## 2016-09-27 VITALS — BP 126/80 | HR 74 | Temp 98.4°F | Resp 17 | Ht 72.5 in | Wt 210.0 lb

## 2016-09-27 DIAGNOSIS — M545 Low back pain: Secondary | ICD-10-CM | POA: Diagnosis not present

## 2016-09-27 DIAGNOSIS — M5442 Lumbago with sciatica, left side: Secondary | ICD-10-CM | POA: Diagnosis not present

## 2016-09-27 DIAGNOSIS — M51379 Other intervertebral disc degeneration, lumbosacral region without mention of lumbar back pain or lower extremity pain: Secondary | ICD-10-CM | POA: Insufficient documentation

## 2016-09-27 DIAGNOSIS — G8929 Other chronic pain: Secondary | ICD-10-CM

## 2016-09-27 DIAGNOSIS — R519 Headache, unspecified: Secondary | ICD-10-CM

## 2016-09-27 DIAGNOSIS — R51 Headache: Secondary | ICD-10-CM | POA: Diagnosis not present

## 2016-09-27 DIAGNOSIS — M5137 Other intervertebral disc degeneration, lumbosacral region: Secondary | ICD-10-CM | POA: Diagnosis not present

## 2016-09-27 DIAGNOSIS — M542 Cervicalgia: Secondary | ICD-10-CM | POA: Diagnosis not present

## 2016-09-27 MED ORDER — CYCLOBENZAPRINE HCL 10 MG PO TABS: 10.0000 mg | ORAL_TABLET | Freq: Every evening | ORAL | 0 refills | Status: DC | PRN

## 2016-09-27 MED ORDER — MELOXICAM 15 MG PO TABS: 15.0000 mg | ORAL_TABLET | Freq: Every day | ORAL | 1 refills | Status: DC
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
## 2016-09-27 NOTE — Patient Instructions (Addendum)
There is arthritis in the low back. Please apply a heating pad to the neck and shoulder area for 15-20 minutes twice a day.  The meloxicam is ONCE a day. You can take it at the time of your choice. Do NOT take Advil (ibuprofen) while you are taking the meloxicam. You CAN take acetaminophen if needed. The cyclobenzaprine causes drowsiness, so only take it at bedtime. The physical therapy office will contact you to schedule.    IF you received an x-ray today, you will [facility] an [facility]. Please contact [facility] at [phone] with questions or concerns regarding your invoice.   IF you received labwork today, you will receive an invoice from [facility]. Please contact [facility] at [phone] with questions or concerns regarding your invoice.   Our billing staff will not be able to assist you with questions regarding bills from these companies.  You will be contacted with the lab results as soon as they are available. The fastest way to get your results is to activate your My Chart account. Instructions are located on the last page of this paperwork. If you have not heard from us regarding the results in 2 weeks, please contact this office.

## 2016-11-07 ENCOUNTER — Other Ambulatory Visit: Payer: Self-pay | Admitting: Physician Assistant

## 2016-11-07 DIAGNOSIS — G8929 Other chronic pain: Secondary | ICD-10-CM

## 2016-11-07 DIAGNOSIS — M5442 Lumbago with sciatica, left side: Principal | ICD-10-CM

## 2016-11-08 NOTE — Telephone Encounter (Signed)
09/27/16 last ov and labs

## 2016-11-11 NOTE — Telephone Encounter (Signed)
Meds ordered this encounter  Medications  . cyclobenzaprine (FLEXERIL) 10 MG tablet    Sig: TAKE 1 TABLET (10 MG TOTAL) BY MOUTH AT BEDTIME AS NEEDED FOR MUSCLE SPASMS.    Dispense:  30 tablet    Refill:  0    How is PT going? He was to follow-up in 1 month (from visit 09/27/2016), but didn't. How is he?

## 2016-12-02 ENCOUNTER — Other Ambulatory Visit: Payer: Self-pay | Admitting: Physician Assistant

## 2016-12-02 DIAGNOSIS — M5442 Lumbago with sciatica, left side: Principal | ICD-10-CM

## 2016-12-02 DIAGNOSIS — G8929 Other chronic pain: Secondary | ICD-10-CM

## 2017-01-03 ENCOUNTER — Other Ambulatory Visit: Payer: Self-pay | Admitting: Physician Assistant

## 2017-01-03 DIAGNOSIS — M5442 Lumbago with sciatica, left side: Principal | ICD-10-CM

## 2017-01-03 DIAGNOSIS — G8929 Other chronic pain: Secondary | ICD-10-CM
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
## 2017-01-05 NOTE — Telephone Encounter (Signed)
11/11/16 last refill 

## 2017-01-10 DIAGNOSIS — E291 Testicular hypofunction: Secondary | ICD-10-CM | POA: Diagnosis not present

## 2017-02-18 ENCOUNTER — Encounter: Payer: Self-pay | Admitting: Physician Assistant

## 2017-02-18 ENCOUNTER — Ambulatory Visit (INDEPENDENT_AMBULATORY_CARE_PROVIDER_SITE_OTHER): Payer: Commercial Managed Care - HMO | Admitting: Physician Assistant

## 2017-02-18 VITALS — BP 118/78 | HR 72 | Temp 98.3°F | Resp 16 | Ht 72.5 in | Wt 203.0 lb

## 2017-02-18 DIAGNOSIS — G44219 Episodic tension-type headache, not intractable: Secondary | ICD-10-CM

## 2017-02-18 DIAGNOSIS — M5137 Other intervertebral disc degeneration, lumbosacral region: Secondary | ICD-10-CM

## 2017-02-18 DIAGNOSIS — G8929 Other chronic pain: Secondary | ICD-10-CM

## 2017-02-18 DIAGNOSIS — M5442 Lumbago with sciatica, left side: Secondary | ICD-10-CM | POA: Diagnosis not present

## 2017-02-18 MED ORDER — CYCLOBENZAPRINE HCL 10 MG PO TABS: 10.0000 mg | ORAL_TABLET | Freq: Every evening | ORAL | 0 refills | Status: DC | PRN

## 2017-02-18 MED ORDER — MELOXICAM 15 MG PO TABS: 15.0000 mg | ORAL_TABLET | Freq: Every day | ORAL | 1 refills | Status: DC

## 2017-02-18 NOTE — Patient Instructions (Signed)
     IF you received an x-ray today, you will receive an invoice from [facility]. Please contact [facility] at [phone] with questions or concerns regarding your invoice.   IF you received labwork today, you will receive an invoice from [facility]. Please contact [facility] at [phone] with questions or concerns regarding your invoice.   Our billing staff will not be able to assist you with questions regarding bills from these companies.  You will be contacted with the lab results as soon as they are available. The fastest way to get your results is to activate your My Chart account. Instructions are located on the last page of this paperwork. If you have not heard from us regarding the results in 2 weeks, please contact this office.     

## 2017-02-18 NOTE — Assessment & Plan Note (Signed)
Resume meloxicam and cyclobenzaprine. Re-refer to PT. Advised to contact this office if he has not received contact regarding an appointment in the next 7 days.
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
## 2017-02-24 DIAGNOSIS — Z0289 Encounter for other administrative examinations: Secondary | ICD-10-CM | POA: Diagnosis not present

## 2017-03-11 ENCOUNTER — Ambulatory Visit: Payer: Commercial Managed Care - HMO | Attending: Physician Assistant | Admitting: Physical Therapy

## 2017-03-11 ENCOUNTER — Encounter: Payer: Self-pay | Admitting: Physical Therapy

## 2017-03-11 DIAGNOSIS — M6281 Muscle weakness (generalized): Secondary | ICD-10-CM | POA: Insufficient documentation

## 2017-03-11 DIAGNOSIS — M5441 Lumbago with sciatica, right side: Secondary | ICD-10-CM | POA: Insufficient documentation

## 2017-03-11 DIAGNOSIS — M62830 Muscle spasm of back: Secondary | ICD-10-CM | POA: Diagnosis present

## 2017-03-11 DIAGNOSIS — G8929 Other chronic pain: Secondary | ICD-10-CM

## 2017-03-11 DIAGNOSIS — R262 Difficulty in walking, not elsewhere classified: Secondary | ICD-10-CM | POA: Insufficient documentation

## 2017-03-11 DIAGNOSIS — R293 Abnormal posture: Secondary | ICD-10-CM | POA: Diagnosis present
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
## 2017-03-11 NOTE — Patient Instructions (Signed)
  Side Twist, Kneeling   Kneel, buttocks on heels. Fold upper body forward from hips. Then reach to each side as far as possible( Mr. Lynnette CaffeyCuthbertson please use hands to left to stretch right side more), keeping chest low toward floor. Hold each position _30__ seconds. Repeat __3_ times per session. Do __2_ sessions per day.  Copyright  VHI. All rights reserved.   Posture Tips DO: - stand tall and erect - keep chin tucked in - keep head and shoulders in alignment - check posture regularly in mirror or large window - pull head back against headrest in car seat;  Change your position often.  Sit with lumbar support. DON'T: - slouch or slump while watching TV or reading - sit, stand or lie in one position  for too long;  Sitting is especially hard on the spine so if you sit at a desk/use the computer, then stand up often!   Copyright  VHI. All rights reserved.  Posture - Standing   Good posture is important. Avoid slouching and forward head thrust. Maintain curve in low back and align ears over shoul- ders, hips over ankles.  Pull your belly button in toward your back bone. Stand on heels and toes evenly.   Belly in, chest /ribs lifted  Up golden thread to the sky,and chin down  Copyright  VHI. All rights reserved.  Posture - Sitting   Sit upright, head facing forward. Try using a roll to support lower back. Keep shoulders relaxed, and avoid rounded back. Keep hips level with knees. Avoid crossing legs for long periods. Sit on sit bones and not tailbone , do not perch on edge of seat.  Copyright  VHI. All rights reserved.   Garen LahLawrie Hoke Baer, PT Certified Exercise Expert for the Aging Adult  03/11/17 8:44 AM Phone: [phone] Fax: [phone]

## 2017-03-20 ENCOUNTER — Ambulatory Visit: Payer: Commercial Managed Care - HMO | Admitting: Physical Therapy

## 2017-03-20 ENCOUNTER — Encounter: Payer: Self-pay | Admitting: Physical Therapy

## 2017-03-20 DIAGNOSIS — M62830 Muscle spasm of back: Secondary | ICD-10-CM

## 2017-03-20 DIAGNOSIS — M5441 Lumbago with sciatica, right side: Secondary | ICD-10-CM | POA: Diagnosis not present

## 2017-03-20 DIAGNOSIS — G8929 Other chronic pain: Secondary | ICD-10-CM

## 2017-03-20 DIAGNOSIS — M6281 Muscle weakness (generalized): Secondary | ICD-10-CM

## 2017-03-20 DIAGNOSIS — R262 Difficulty in walking, not elsewhere classified: Secondary | ICD-10-CM

## 2017-03-20 DIAGNOSIS — R293 Abnormal posture: Secondary | ICD-10-CM

## 2017-03-20 NOTE — Patient Instructions (Signed)
Trigger Point Dry Needling  . What is Trigger Point Dry Needling (DN)? o DN is a physical therapy technique used to treat muscle pain and dysfunction. Specifically, DN helps deactivate muscle trigger points (muscle knots).  o A thin filiform needle is used to penetrate the skin and stimulate the underlying trigger point. The goal is for a local twitch response (LTR) to occur and for the trigger point to relax. No medication of any kind is injected during the procedure.   . What Does Trigger Point Dry Needling Feel Like?  o The procedure feels different for each individual patient. Some patients report that they do not actually feel the needle enter the skin and overall the process is not painful. Very mild bleeding may occur. However, many patients feel a deep cramping in the muscle in which the needle was inserted. This is the local twitch response.   Marland Kitchen How Will I feel after the treatment? o Soreness is normal, and the onset of soreness may not occur for a few hours. Typically this soreness does not last longer than two days.  o Bruising is uncommon, however; ice can be used to decrease any possible bruising.  o In rare cases feeling tired or nauseous after the treatment is normal. In addition, your symptoms may get worse before they get better, this period will typically not last longer than 24 hours.   . What Can I do After My Treatment? o Increase your hydration by drinking more water for the next 24 hours. o You may place ice or heat on the areas treated that have become sore, however, do not use heat on inflamed or bruised areas. Heat often brings more relief post needling. o You can continue your regular activities, but vigorous activity is not recommended initially after the treatment for 24 hours. o DN is best combined with other physical therapy such as strengthening, stretching, and other therapies.   Sleeping on Back  Place pillow under knees. A pillow with cervical support and a  roll around waist are also helpful. Copyright  VHI. All rights reserved.  Sleeping on Side Place pillow between knees. Use cervical support under neck and a roll around waist as needed. Copyright  VHI. All rights reserved.   Sleeping on Stomach   If this is the only desirable sleeping position, place pillow under lower legs, and under stomach or chest as needed.  Posture - Sitting   Sit upright, head facing forward. Try using a roll to support lower back. Keep shoulders relaxed, and avoid rounded back. Keep hips level with knees. Avoid crossing legs for long periods. Stand to Sit / Sit to Stand   To sit: Bend knees to lower self onto front edge of chair, then scoot back on seat. To stand: Reverse sequence by placing one foot forward, and scoot to front of seat. Use rocking motion to stand up.   Work Height and Reach  Ideal work height is no more than 2 to 4 inches below elbow level when standing, and at elbow level when sitting. Reaching should be limited to arm's length, with elbows slightly bent.  Bending  Bend at hips and knees, not back. Keep feet shoulder-width apart.    Posture - Standing   Good posture is important. Avoid slouching and forward head thrust. Maintain curve in low back and align ears over shoul- ders, hips over ankles.  Alternating Positions   Alternate tasks and change positions frequently to reduce fatigue and muscle tension. Take rest  breaks. Computer Work   Position work to Art gallery managerface forward. Use proper work and seat height. Keep shoulders back and down, wrists straight, and elbows at right angles. Use chair that provides full back support. Add footrest and lumbar roll as needed.  Getting Into / Out of Car  Lower self onto seat, scoot back, then bring in one leg at a time. Reverse sequence to get out.  Dressing  Lie on back to pull socks or slacks over feet, or sit and bend leg while keeping back straight.    Housework - Sink  Place one foot  on ledge of cabinet under sink when standing at sink for prolonged periods.   Pushing / Pulling  Pushing is preferable to pulling. Keep back in proper alignment, and use leg muscles to do the work.  Deep Squat   Squat and lift with both arms held against upper trunk. Tighten stomach muscles without holding breath. Use smooth movements to avoid jerking.  Avoid Twisting   Avoid twisting or bending back. Pivot around using foot movements, and bend at knees if needed when reaching for articles.  Carrying Luggage   Distribute weight evenly on both sides. Use a cart whenever possible. Do not twist trunk. Move body as a unit.   Lifting Principles .Maintain proper posture and head alignment. .Slide object as close as possible before lifting. .Move obstacles out of the way. .Test before lifting; ask for help if too heavy. .Tighten stomach muscles without holding breath. .Use smooth movements; do not jerk. .Use legs to do the work, and pivot with feet. .Distribute the work load symmetrically and close to the center of trunk. .Push instead of pull whenever possible.   Ask For Help   Ask for help and delegate to others when possible. Coordinate your movements when lifting together, and maintain the low back curve.  Log Roll   Lying on back, bend left knee and place left arm across chest. Roll all in one movement to the right. Reverse to roll to the left. Always move as one unit. Housework - Sweeping  Use long-handled equipment to avoid stooping.   Housework - Wiping  Position yourself as close as possible to reach work surface. Avoid straining your back.  Laundry - Unloading Wash   To unload small items at bottom of washer, lift leg opposite to arm being used to reach.  Gardening - Raking  Move close to area to be raked. Use arm movements to do the work. Keep back straight and avoid twisting.     Cart  When reaching into cart with one arm, lift opposite leg to keep  back straight.   Getting Into / Out of Bed  Lower self to lie down on one side by raising legs and lowering head at the same time. Use arms to assist moving without twisting. Bend both knees to roll onto back if desired. To sit up, start from lying on side, and use same move-ments in reverse. Housework - Vacuuming  Hold the vacuum with arm held at side. Step back and forth to move it, keeping head up. Avoid twisting.   Laundry - Armed forces training and education officerLoading Wash  Position laundry basket so that bending and twisting can be avoided.   Laundry - Unloading Dryer  Squat down to reach into clothes dryer or use a reacher.  Gardening - Weeding / Psychiatric nurselanting  Squat or Kneel. Knee pads may be helpful.  Andrew Jacobs, PT Certified Exercise Expert for the Aging Adult  03/20/17 9:01 AM Phone: [phone] Fax: [phone]

## 2017-03-20 NOTE — Therapy (Signed)
[facility] [address] [place], [place], [address] Phone: [phone]   Fax:  [phone]  Physical Therapy Treatment  Patient Details  Name: Andrew Jacobs MRN: [id number] Date of Birth: [birth year] Referring Provider: Theora Gianottihelle, Jeffrey PA  Encounter Date: 03/20/2017      PT End of Session - 03/20/17 0901    Visit Number 2   Number of Visits 13   Date for PT Re-Evaluation 05/06/17   Authorization Type UHC   Authorization Time Period 04-22-17   PT Start Time 0850   PT Stop Time 0946   PT Time Calculation (min) 56 min   Activity Tolerance Patient tolerated treatment well   Behavior During Therapy [facility] for tasks assessed/performed      Past Medical History:  Diagnosis Date  . Allergy   . Testicular torsion     Past Surgical History:  Procedure Laterality Date  . KNEE SURGERY    . SHOULDER SURGERY      There were no vitals filed for this visit.      Subjective Assessment - 03/20/17 0856    Subjective I was having numbness down my right leg and I had problems at work with sitting and moving.  I am ready to get this taken care of and feel better.   Pertinent History in the past excellent physcial condition. and no medical issues   Limitations Other (comment);House hold activities;Walking;Standing;Sitting;Lifting   How long can you sit comfortably? 10 minutes    How long can you stand comfortably? 10 minutes   How long can you walk comfortably? 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 Yes   Pain Score 6    Pain Location Back   Pain Orientation Right   Pain Descriptors / Indicators Aching;Radiating;Burning   Pain Type Chronic pain   Pain Onset More than a month ago   Pain Frequency Constant                         [facility] Adult PT Treatment/Exercise - 03/20/17 0859      Self-Care   Self-Care Other Self-Care Comments   ADL's handout on ADL's    Lifting explanation only   Posture review of sitting and standing posture   Other Self-Care Comments  education on TDN with precautians and aftercare and explanation     Modalities   Modalities Moist Heat     Moist Heat Therapy   Number Minutes Moist Heat 15 Minutes   Moist Heat Location Lumbar Spine  Right QL in left sidelyin     Manual Therapy   Manual Therapy Soft tissue mobilization;Myofascial release   Soft tissue mobilization gluteals and piriforumis with IASTYM tool   Myofascial Release right QL          Trigger Point Dry Needling - 03/20/17 0904    Consent Given? Yes   Education Handout Provided Yes   Muscles Treated Upper Body Quadratus Lumborum  right side QL only    Muscles Treated Lower Body Gluteus minimus;Gluteus maximus;Piriformis  right side only   Gluteus Maximus Response Twitch response elicited;Palpable increased muscle length   Gluteus Minimus Response Twitch response elicited;Palpable increased muscle length   Piriformis Response Twitch response elicited;Palpable increased muscle length  PT Education - 03/20/17 0901    Education provided Yes   Education Details TDN precautian and aftercare  and ADL lifting and posture handout   Person(s) Educated Patient   Methods Explanation   Comprehension Verbalized understanding          PT Short Term Goals - 03/20/17 0925      PT SHORT TERM GOAL #1   Title "Independent with initial HEP   Time 4   Period Weeks   Status On-going     PT SHORT TERM GOAL #2   Title "Report pain decrease from 8/10 to4 /10.   Baseline  Pt 6/10 reduced to 5/10 after RX   Time 4   Period Weeks   Status On-going     PT SHORT TERM GOAL #3   Title "Demonstrate understanding of proper sitting posture and be more conscious of position and posture throughout the day.    Baseline intially given handouts and education   Time 4    Period Weeks   Status On-going     PT SHORT TERM GOAL #4   Title Pt will be able to tolerate sitting for driving, working for 30 minutes without increase of back pain   Time 4   Period Weeks   Status On-going           PT Long Term Goals - 03/11/17 0845      PT LONG TERM GOAL #1   Title "Demonstrate and verbalize techniques to reduce the risk of re-injury including: lifting, posture, body mechanics.    Time 8   Period Weeks   Status New     PT LONG TERM GOAL #2   Title "Pt will be independent with advanced HEP.    Time 8   Period Weeks   Status New     PT LONG TERM GOAL #3   Title "Pt will tolerate sitting 90 minutes without increased pain to ride in car without increased pain   Time 8   Period Weeks   Status New     PT LONG TERM GOAL #4   Title "FOTO will improve from  64% limitation  to  43% limitation    indicating improved functional mobility.    Time 8   Period Weeks   Status New     PT LONG TERM GOAL #5   Title Pt will be able to return to physical activity and be able to run for 1/2 a mile without exacerbating pain in back   Time 8   Period Weeks   Status New     PT LONG TERM GOAL #6   Title "Pt will tolerate walking for 2 hours without increased pain in order to return to PLOF    Time 8   Period Weeks   Status New               Plan - 03/20/17 1610    Clinical Impression Statement Pt reported 6/10 in right low back .  After RX and palplable lengthening post TDN.  Pt consented to TDN and had almost immediate releif of muscle tension in Right Quadratus lumborum. . Pt also intially educated on ADL, lifting and posture and given handout to empahzize next visit. . Pt was monitored throughtout session. for TDN.    Rehab Potential Good   PT Frequency 2x / week   PT Duration 8 weeks   PT Treatment/Interventions ADLs/Self Care Home Management;Taping;Dry needling;Electrical Stimulation;Iontophoresis 4mg /ml Dexamethasone;Cryotherapy;Moist  Heat;Traction;Ultrasound;Functional mobility training;Therapeutic activities;Therapeutic  exercise;Neuromuscular re-education;Patient/family education;Passive range of motion;Manual techniques   PT Next Visit Plan  assess dry needling, Basic ADL's basic low back pre pilates training to progress as able   PT Home Exercise Plan child's pose, intial posture sitting and standing.    Consulted and Agree with Plan of Care Patient      Patient will benefit from skilled therapeutic intervention in order to improve the following deficits and impairments:  Hypomobility, Pain, Impaired flexibility, Increased fascial restricitons, Decreased range of motion, Decreased strength, Decreased mobility, Decreased activity tolerance, Improper body mechanics, Postural dysfunction  Visit Diagnosis: Chronic right-sided low back pain with right-sided sciatica  Muscle spasm of back  Abnormal posture  Muscle weakness (generalized)  Difficulty in walking, not elsewhere classified     Problem List Patient Active Problem List   Diagnosis Date Noted  . DDD (degenerative disc disease), lumbosacral 09/27/2016  . Urinary incontinence 10/09/2015  . Erectile dysfunction 10/09/2015    Garen Lah, PT Certified Exercise Expert for the Aging Adult  03/20/17 9:29 AM Phone: [phone] Fax: [phone]  [facility] [address] [place], [place], [address] Phone: [phone]   Fax:  [phone]  Name: Andrew Jacobs MRN: [id number] Date of Birth: 1966/08/18

## 2017-03-31 ENCOUNTER — Ambulatory Visit: Payer: 59 | Admitting: Physical Therapy

## 2017-04-04 ENCOUNTER — Ambulatory Visit: Payer: 59 | Attending: Physician Assistant | Admitting: Physical Therapy

## 2017-04-04 DIAGNOSIS — M6281 Muscle weakness (generalized): Secondary | ICD-10-CM

## 2017-04-04 DIAGNOSIS — M62830 Muscle spasm of back: Secondary | ICD-10-CM | POA: Diagnosis present

## 2017-04-04 DIAGNOSIS — R293 Abnormal posture: Secondary | ICD-10-CM | POA: Diagnosis present

## 2017-04-04 DIAGNOSIS — G8929 Other chronic pain: Secondary | ICD-10-CM | POA: Diagnosis present

## 2017-04-04 DIAGNOSIS — R262 Difficulty in walking, not elsewhere classified: Secondary | ICD-10-CM | POA: Diagnosis present

## 2017-04-04 DIAGNOSIS — M5441 Lumbago with sciatica, right side: Secondary | ICD-10-CM | POA: Insufficient documentation
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
## 2017-04-08 ENCOUNTER — Ambulatory Visit: Payer: 59 | Admitting: Physical Therapy

## 2017-04-08 ENCOUNTER — Encounter: Payer: Self-pay | Admitting: Physical Therapy

## 2017-04-08 DIAGNOSIS — M5441 Lumbago with sciatica, right side: Principal | ICD-10-CM

## 2017-04-08 DIAGNOSIS — M62830 Muscle spasm of back: Secondary | ICD-10-CM

## 2017-04-08 DIAGNOSIS — R262 Difficulty in walking, not elsewhere classified: Secondary | ICD-10-CM

## 2017-04-08 DIAGNOSIS — R293 Abnormal posture: Secondary | ICD-10-CM

## 2017-04-08 DIAGNOSIS — M6281 Muscle weakness (generalized): Secondary | ICD-10-CM

## 2017-04-08 DIAGNOSIS — G8929 Other chronic pain: Secondary | ICD-10-CM
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
## 2017-04-14 ENCOUNTER — Other Ambulatory Visit: Payer: Self-pay | Admitting: Physician Assistant

## 2017-04-14 ENCOUNTER — Ambulatory Visit: Payer: 59 | Admitting: Physical Therapy

## 2017-04-14 DIAGNOSIS — M62830 Muscle spasm of back: Secondary | ICD-10-CM

## 2017-04-14 DIAGNOSIS — M5441 Lumbago with sciatica, right side: Secondary | ICD-10-CM | POA: Diagnosis not present

## 2017-04-14 DIAGNOSIS — R262 Difficulty in walking, not elsewhere classified: Secondary | ICD-10-CM

## 2017-04-14 DIAGNOSIS — M5442 Lumbago with sciatica, left side: Principal | ICD-10-CM

## 2017-04-14 DIAGNOSIS — R293 Abnormal posture: Secondary | ICD-10-CM

## 2017-04-14 DIAGNOSIS — M6281 Muscle weakness (generalized): Secondary | ICD-10-CM

## 2017-04-14 DIAGNOSIS — G8929 Other chronic pain: Secondary | ICD-10-CM

## 2017-04-14 NOTE — Therapy (Signed)
[facility] [address] [place], [place], [address] Phone: [phone]   Fax:  [phone]  Physical Therapy Treatment  Patient Details  Name: Andrew Jacobs MRN: [id number] Date of Birth: [birth year] Referring Provider: Theora Gianottihelle, Jeffrey PA  Encounter Date: 04/14/2017      PT End of Session - 04/14/17 0841    Visit Number 5   Number of Visits 13   Date for PT Re-Evaluation 05/06/17   Authorization Type UHC   Authorization Time Period 04-22-17   PT Start Time 0803   PT Stop Time 0845   PT Time Calculation (min) 42 min      Past Medical History:  Diagnosis Date  . Allergy   . Testicular torsion     Past Surgical History:  Procedure Laterality Date  . KNEE SURGERY    . SHOULDER SURGERY      There were no vitals filed for this visit.      Subjective Assessment - 04/14/17 0806    Subjective Dry needling is always helpful. I was able to work without increased pain.    Currently in Pain? Yes   Pain Score 6    Pain Location Back   Pain Orientation Right   Pain Descriptors / Indicators Shooting   Pain Radiating Towards right buttock                         [facility] Adult PT Treatment/Exercise - 04/14/17 0001      Lumbar Exercises: Supine   Clam 10 reps  green band    Clam Limitations with abdominal draw in   Heel Slides 10 reps   Heel Slides Limitations with abdominal draw in, progressed to Level 2 ( table top with alternating taps one at a time)    Bent Knee Raise 10 reps   Bent Knee Raise Limitations with abdominal draw in   Bridge 10 reps   Bridge Limitations then with green band clam  x 10, then ball squeeze x 10      Lumbar Exercises: Sidelying   Clam 20 reps  green band    Clam Limitations with abdominal draw in      Modalities   Modalities Traction     Traction   Type of Traction Lumbar   Min (lbs) 45   Max (lbs) 65   Hold Time 60   Rest Time 10   Time 15                   PT Short Term Goals - 04/08/17 0819      PT SHORT TERM GOAL #1   Title "Independent with initial HEP   Baseline Independent with prepilates and clams initial   Time 4   Period Weeks   Status Achieved     PT SHORT TERM GOAL #2   Title "Report pain decrease from 8/10 to4 /10.   Baseline Pt at 6/10 and reduced to 4/10   Time 4   Period Weeks   Status On-going     PT SHORT TERM GOAL #3   Title "Demonstrate understanding of proper sitting posture and be more conscious of position and posture throughout the day.    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    Time 8   Period Weeks   Status New     PT LONG TERM GOAL #2   Title "Pt will be independent with advanced HEP.    Time 8   Period Weeks   Status New     PT LONG TERM GOAL #3   Title "Pt will tolerate sitting 90 minutes without increased pain to ride in car without increased pain   Time 8   Period Weeks   Status New     PT LONG TERM GOAL #4   Title "FOTO will improve from  64% limitation  to  43% limitation    indicating improved functional mobility.    Time 8   Period Weeks   Status New     PT LONG TERM GOAL #5   Title Pt will be able to return to physical activity and be able to run for 1/2 a mile without exacerbating pain in back   Time 8   Period Weeks   Status New     PT LONG TERM GOAL #6   Title "Pt will tolerate walking for 2 hours without increased  pain in order to return to PLOF    Time 8   Period Weeks   Status New               Plan - 04/14/17 1610    Clinical Impression Statement Pt reports Dry needling reduced his pain and he was able to work without pain. He feels he still over did it a little at work and his pain returned and he rates it at a 6/10 today. Progressed core exercise. Pt reports in previous PT the focus was more stretching and not core work. Discussed the benefit of core exercise on lumbar stabilization and pain. Trial of mechanical traction today. Will assess response next visit.    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 prepilates series clams, tabel top with alternating heel taps    Consulted and Agree with Plan of Care Patient      Patient will benefit from skilled therapeutic intervention in order to improve the following deficits and impairments:  Hypomobility, Pain, Impaired flexibility, Increased fascial restricitons, Decreased range of motion, Decreased strength, Decreased mobility, Decreased activity tolerance, Improper body mechanics, Postural dysfunction  Visit Diagnosis: Chronic right-sided low back pain with right-sided sciatica  Muscle spasm of back  Abnormal posture  Muscle weakness (generalized)  Difficulty in walking, not elsewhere classified     Problem List Patient Active Problem List   Diagnosis Date Noted  . DDD (degenerative disc disease), lumbosacral 09/27/2016  . Urinary incontinence 10/09/2015  . Erectile dysfunction 10/09/2015    Sherrie Mustache, PTA 04/14/2017, 9:04 AM  [facility] [address] [place], [place], [address] Phone: [phone]  Fax:  [phone]  Name: Andrew Jacobs MRN: [id number] Date  of Birth: 1965-12-03

## 2017-04-22 ENCOUNTER — Encounter: Payer: Self-pay | Admitting: Physical Therapy

## 2017-04-22 ENCOUNTER — Encounter: Payer: Self-pay | Admitting: Physician Assistant

## 2017-04-22 ENCOUNTER — Ambulatory Visit: Payer: 59 | Admitting: Physical Therapy

## 2017-04-22 ENCOUNTER — Ambulatory Visit (INDEPENDENT_AMBULATORY_CARE_PROVIDER_SITE_OTHER): Payer: 59 | Admitting: Physician Assistant

## 2017-04-22 VITALS — BP 112/75 | HR 79 | Temp 98.0°F | Resp 18 | Ht 72.0 in | Wt 202.4 lb

## 2017-04-22 DIAGNOSIS — R293 Abnormal posture: Secondary | ICD-10-CM

## 2017-04-22 DIAGNOSIS — M542 Cervicalgia: Secondary | ICD-10-CM

## 2017-04-22 DIAGNOSIS — M62830 Muscle spasm of back: Secondary | ICD-10-CM

## 2017-04-22 DIAGNOSIS — M6281 Muscle weakness (generalized): Secondary | ICD-10-CM

## 2017-04-22 DIAGNOSIS — R519 Headache, unspecified: Secondary | ICD-10-CM

## 2017-04-22 DIAGNOSIS — G8929 Other chronic pain: Secondary | ICD-10-CM | POA: Diagnosis not present

## 2017-04-22 DIAGNOSIS — R262 Difficulty in walking, not elsewhere classified: Secondary | ICD-10-CM

## 2017-04-22 DIAGNOSIS — M5442 Lumbago with sciatica, left side: Secondary | ICD-10-CM | POA: Diagnosis not present

## 2017-04-22 DIAGNOSIS — Z23 Encounter for immunization: Secondary | ICD-10-CM | POA: Diagnosis not present

## 2017-04-22 DIAGNOSIS — M5137 Other intervertebral disc degeneration, lumbosacral region: Secondary | ICD-10-CM

## 2017-04-22 DIAGNOSIS — R51 Headache: Secondary | ICD-10-CM

## 2017-04-22 DIAGNOSIS — M5441 Lumbago with sciatica, right side: Principal | ICD-10-CM

## 2017-04-22 MED ORDER — MELOXICAM 15 MG PO TABS: 15.0000 mg | ORAL_TABLET | Freq: Every day | ORAL | 1 refills | Status: DC

## 2017-04-22 MED ORDER — CYCLOBENZAPRINE HCL 10 MG PO TABS: 10.0000 mg | ORAL_TABLET | Freq: Every evening | ORAL | 0 refills | Status: DC | PRN

## 2017-04-22 NOTE — Progress Notes (Signed)
Patient ID: Andrew Jacobs, male    DOB: [birth year], 51 y.o.   MRN: [id number]  PCP: Veryl Speakalone, Gregory D, FNP  Chief Complaint  Patient presents with  . Back, neck pain    per pt with therapy the pain has become "bareable"  . Headache    per pt states headaches are about the same from last visit.  . Follow-up  . Medication Refill    Flexeril 10 MG, Meloxicam 15 MG    Subjective:   Presents for evaluation of back pain.  He relates that he has had considerable improvement with PT, though his pain persists. His neck pain continues, but the PT cannot address it until they receive an order for that specifically. He thinks that his HA are triggered by the neck pain. The HA occurs on the LEFT side of the head, extend into the neck and shoulder. He uses ibuprofen and acetaminophen to ease the discomfort.  Continues meloxicam and cyclobenzaprine, which also help.    Review of Systems As above. No CP, SOB, dizziness. No nausea, vomiting, diarrhea.    Patient Active Problem List   Diagnosis Date Noted  . DDD (degenerative disc disease), lumbosacral 09/27/2016  . Urinary incontinence 10/09/2015  . Erectile dysfunction 10/09/2015     Prior to Admission medications   Medication Sig Start Date End Date Taking? Authorizing Provider  cyclobenzaprine (FLEXERIL) 10 MG tablet Take 1 tablet (10 mg total) by mouth at bedtime as needed for muscle spasms. 04/22/17  Yes Anastacia Reinecke, PA-C  meloxicam (MOBIC) 15 MG tablet Take 1 tablet (15 mg total) by mouth daily. 04/22/17  Yes Wyolene Weimann, PA-C     No Known Allergies     Objective:  Physical Exam  Constitutional: He is oriented to person, place, and time. He appears well-developed and well-nourished. He is active and cooperative. No distress.  BP 112/75   Pulse 79   Temp 98 F (36.7 C) (Oral)   Resp 18   Ht 6' (1.829 m)   Wt 202 lb 6.4 oz (91.8 kg)   SpO2 97%   BMI 27.45 kg/m   HENT:  Head: Normocephalic and  atraumatic.  Right Ear: Hearing normal.  Left Ear: Hearing normal.  Eyes: Conjunctivae are normal. No scleral icterus.  Neck: Normal range of motion. Neck supple. No thyromegaly present.  Cardiovascular: Normal rate, regular rhythm and normal heart sounds.   Pulses:      Radial pulses are 2+ on the right side, and 2+ on the left side.  Pulmonary/Chest: Effort normal and breath sounds normal.  Musculoskeletal:       Cervical back: He exhibits tenderness (LEFT) and pain. He exhibits normal range of motion, no bony tenderness, no swelling, no edema, no deformity, no laceration, no spasm and normal pulse.       Thoracic back: Normal.       Lumbar back: He exhibits tenderness (LEFT ) and pain. He exhibits normal range of motion, no bony tenderness, no swelling, no edema, no deformity, no laceration, no spasm and normal pulse.  Lymphadenopathy:       Head (right side): No tonsillar, no preauricular, no posterior auricular and no occipital adenopathy present.       Head (left side): No tonsillar, no preauricular, no posterior auricular and no occipital adenopathy present.    He has no cervical adenopathy.       Right: No supraclavicular adenopathy present.       Left: No supraclavicular adenopathy  present.  Neurological: He is alert and oriented to person, place, and time. No sensory deficit.  Skin: Skin is warm, dry and intact. No rash noted. No cyanosis or erythema. Nails show no clubbing.  Psychiatric: He has a normal mood and affect. His speech is normal and behavior is normal.       Assessment & Plan:   Problem List Items Addressed This Visit    DDD (degenerative disc disease), lumbosacral - Primary    COntinue meloxicam, PRN cyclobenzaprine and PT. Advised to STOP the ibuprofen and avoid naproxen.      Relevant Medications   meloxicam (MOBIC) 15 MG tablet   cyclobenzaprine (FLEXERIL) 10 MG tablet    Other Visit Diagnoses    Chronic midline low back pain with left-sided sciatica        Relevant Medications   meloxicam (MOBIC) 15 MG tablet   cyclobenzaprine (FLEXERIL) 10 MG tablet   Neck pain       refer for PT for this issue as well.   Relevant Orders   Ambulatory referral to Physical Therapy   Nonintractable headache, unspecified chronicity pattern, unspecified headache type       likely tension type HA. If no improvement with PT to the neck, plan referral to neurology.   Relevant Medications   meloxicam (MOBIC) 15 MG tablet   cyclobenzaprine (FLEXERIL) 10 MG tablet   Need for Tdap vaccination       Relevant Orders   Tdap vaccine greater than or equal to 7yo IM (Completed)   Care order/instruction:       Return in about 4 weeks (around 05/20/2017) for re-evaluation of neck, backa nd head pain.   Fernande Brashelle S. Kayren Holck, PA-C [facility]
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
## 2017-04-22 NOTE — Therapy (Signed)
[facility] [address] [place], [place], [address] Phone: [phone]   Fax:  [phone]  Physical Therapy Treatment  Patient Details  Name: Andrew Spearingrnest L Ferraz MRN: [id number] Date of Birth: April 24, 1966 Referring Provider: Theora Gianottihelle, Jeffrey PA  Encounter Date: 04/22/2017      PT End of Session - 04/22/17 0806    Visit Number 6   Number of Visits 13   Date for PT Re-Evaluation 05/06/17   Authorization Type U[employer]   Authorization Time Period 04-22-17   PT Start Time 0805   PT Stop Time 0901   PT Time Calculation (min) 56 min   Activity Tolerance Patient tolerated treatment well   Behavior During Therapy [facility] for tasks assessed/performed      Past Medical History:  Diagnosis Date  . Allergy   . Testicular torsion     Past Surgical History:  Procedure Laterality Date  . KNEE SURGERY    . SHOULDER SURGERY      There were no vitals filed for this visit.      Subjective Assessment - 04/22/17 0808    Subjective I was able to ride a stationary bike for 15 min at the gym this past weekend.  I feel like the traction and the TDN have helped.    Pertinent History in the past excellent physcial condition. and no medical issues   How long can you sit comfortably? 10-15 minutes tops and must fidget or stand up   How long can you stand comfortably? 15 -30 minutes   How long can you walk comfortably? 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 Yes   Pain Score 5    Pain Location Back   Pain Orientation Right   Pain Descriptors / Indicators Nagging   Pain Type Chronic pain   Pain Onset More than a month ago   Pain Frequency Constant            [facility] PT Assessment - 04/22/17 0839      Observation/Other Assessments   Focus on Therapeutic Outcomes (FOTO)  FOTO intake 49% predicted 43%  limitation  today 51%     AROM   Lumbar Flexion 65  ERP   Lumbar Extension 20  ERP   Lumbar - Right Side Bend 20  ERP   Lumbar - Left Side Bend 20   Lumbar - Right Rotation 75%   Lumbar - Left Rotation 75%                     [facility] Adult PT Treatment/Exercise - 04/22/17 0813      Lumbar Exercises: Stretches   Quadruped Mid Back Stretch 3 reps   Quadruped Mid Back Stretch Limitations prayer stretch forward and side to side     Lumbar Exercises: Supine   Bent Knee Raise 10 reps  x2 to review   Bent Knee Raise Limitations with abdominal draw in   Bridge 10 reps     Modalities   Modalities Traction     Moist Heat Therapy   Number Minutes Moist Heat 15 Minutes   Moist Heat Location Lumbar Spine     Traction   Type of Traction Lumbar   Min (lbs) 45   Max (lbs) 75   Hold Time 60   Rest Time 10  Time 15     Manual Therapy   Manual Therapy Soft tissue mobilization;Myofascial release   Manual therapy comments IR and ER stretch with trigger point release   Joint Mobilization PA lumbar UPA on right L-3 to L-5 and sacrum   Soft tissue mobilization gluteals and piriforumis with IASTYM tool   Myofascial Release right QL          Trigger Point Dry Needling - 04/22/17 0813    Consent Given? Yes   Education Handout Provided --  verbally reminded    Muscles Treated Upper Body Quadratus Lumborum;Gluteus minimus;Gluteus maximus;Piriformis  right side    Gluteus Maximus Response Twitch response elicited;Palpable increased muscle length   Gluteus Minimus Response Twitch response elicited;Palpable increased muscle length   Piriformis Response Twitch response elicited;Palpable increased muscle length              PT Education - 04/22/17 1043    Education provided Yes   Education Details reinforced HEP and need for exercise over passive treatments from now on to prepare him for work activities   Person(s) Educated Patient   Methods Explanation;Demonstration;Tactile  cues;Verbal cues   Comprehension Verbalized understanding;Returned demonstration          PT Short Term Goals - 04/22/17 [id number]      PT SHORT TERM GOAL #1   Title "Independent with initial HEP   Baseline Moving onto core abdominal exercises today.   Time 4   Period Weeks   Status Achieved     PT SHORT TERM GOAL #2   Title "Report pain decrease from 8/10 to4 /10.   Baseline Pt is 5/10 today   Time 4   Period Weeks   Status On-going     PT SHORT TERM GOAL #3   Title "Demonstrate understanding of proper sitting posture and be more conscious of position and posture throughout the day.    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    Time 8   Period Weeks   Status New     PT LONG TERM GOAL #2   Title "Pt will be independent with advanced HEP.    Time 8   Period Weeks   Status New     PT LONG TERM GOAL #3   Title "Pt will tolerate sitting 90 minutes without increased pain to ride in car without increased pain   Time 8   Period Weeks   Status New     PT LONG TERM GOAL #4   Title "FOTO will improve from  64% limitation  to  43% limitation    indicating improved functional mobility.    Time 8   Period Weeks   Status New     PT LONG TERM GOAL #5   Title Pt will be able to return to physical  activity and be able to run for 1/2 a mile without exacerbating pain in back   Time 8   Period Weeks   Status New     PT LONG TERM GOAL #6   Title "Pt will  tolerate walking for 2 hours without increased pain in order to return to PLOF    Time 8   Period Weeks   Status New               Plan - 04/22/17 4098    Clinical Impression Statement Pt reports going to the gym and able to use stationary bike for 15 minutes.  Pt understands importance of stregnthening core in order to progress to extremity strengthening.  Pt FOTO improved to 51% limitation from 64% on eval.  Pt with increased lumbar AROM but with end range pain.  Pt was 5/10 pain level and reducedt to 4/10 at end of treatment. Will continue to progress toward LTG's    Rehab Potential Good   PT Frequency 2x / week   PT Duration 8 weeks   PT Treatment/Interventions ADLs/Self Care Home Management;Taping;Dry needling;Electrical Stimulation;Iontophoresis 4mg /ml Dexamethasone;Cryotherapy;Moist Heat;Traction;Ultrasound;Functional mobility training;Therapeutic activities;Therapeutic exercise;Neuromuscular re-education;Patient/family education;Passive range of motion;Manual techniques   PT Next Visit Plan more TPDN  Beneficial.  Pt likes traction but knows he needs to progress to exercise Pt needs to add to HEP for core progression.   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     PT Home Exercise Plan child's pose, intial posture sitting and standing. prepilates series clams, tabel top with alternating heel taps    Consulted and Agree with Plan of Care Patient      Patient will benefit from skilled therapeutic intervention in order to improve the following deficits and impairments:  Hypomobility, Pain, Impaired flexibility, Increased fascial restricitons, Decreased range of motion, Decreased strength, Decreased mobility, Decreased activity tolerance, Improper body mechanics, Postural dysfunction  Visit Diagnosis: Chronic right-sided low back pain with right-sided sciatica  Muscle spasm of back  Abnormal posture  Muscle  weakness (generalized)  Difficulty in walking, not elsewhere classified     Problem List Patient Active Problem List   Diagnosis Date Noted  . DDD (degenerative disc disease), lumbosacral 09/27/2016  . Urinary incontinence 10/09/2015  . Erectile dysfunction 10/09/2015  Garen Lah, PT Certified Exercise Expert for the Aging Adult  04/22/17 10:46 AM Phone: [phone] Fax: [phone]  [facility] [address] [place], [place], [address] Phone: [phone]   Fax:  [phone]  Name: ARLIN SASS MRN: [id number] Date of Birth: 1965-10-19

## 2017-04-22 NOTE — Patient Instructions (Addendum)
STOP the ibuprofen. CONTINUE the meloxicam. You may take Tylenol (acetaminophen) if needed.    IF you received an x-ray today, you will receive an invoice from [facility]. Please contact [facility] at [phone] with questions or concerns regarding your invoice.   IF you received labwork today, you will receive an invoice from [facility]. Please contact [facility] at [phone] with questions or concerns regarding your invoice.   Our billing staff will not be able to assist you with questions regarding bills from these companies.  You will be contacted with the lab results as soon as they are available. The fastest way to get your results is to activate your My Chart account. Instructions are located on the last page of this paperwork. If you have not heard from us regarding the results in 2 weeks, please contact this office.

## 2017-04-22 NOTE — Therapy (Signed)
[facility] [address] [place], [place], [address] Phone: [phone]   Fax:  [phone]  Physical Therapy Treatment  Patient Details  Name: Andrew Jacobs MRN: [id number] Date of Birth: April 24, 1966 Referring Provider: Theora Gianottihelle, Jeffrey PA  Encounter Date: 04/22/2017      PT End of Session - 04/22/17 0806    Visit Number 6   Number of Visits 13   Date for PT Re-Evaluation 05/06/17   Authorization Type UHC   Authorization Time Period 04-22-17   PT Start Time 0805   PT Stop Time 0901   PT Time Calculation (min) 56 min   Activity Tolerance Patient tolerated treatment well   Behavior During Therapy [facility] for tasks assessed/performed      Past Medical History:  Diagnosis Date  . Allergy   . Testicular torsion     Past Surgical History:  Procedure Laterality Date  . KNEE SURGERY    . SHOULDER SURGERY      There were no vitals filed for this visit.      Subjective Assessment - 04/22/17 0808    Subjective I was able to ride a stationary bike for 15 min at the gym this past weekend.  I feel like the traction and the TDN have helped.    Pertinent History in the past excellent physcial condition. and no medical issues   How long can you sit comfortably? 10-15 minutes tops and must fidget or stand up   How long can you stand comfortably? 15 -30 minutes   How long can you walk comfortably? 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 Yes   Pain Score 5    Pain Location Back   Pain Orientation Right   Pain Descriptors / Indicators Nagging   Pain Type Chronic pain   Pain Onset More than a month ago   Pain Frequency Constant            [facility] PT Assessment - 04/22/17 0839      Observation/Other Assessments   Focus on Therapeutic Outcomes (FOTO)  FOTO intake 49% predicted 43%  limitation  today 51%     AROM   Lumbar Flexion 65  ERP   Lumbar Extension 20  ERP   Lumbar - Right Side Bend 20  ERP   Lumbar - Left Side Bend 20   Lumbar - Right Rotation 75%   Lumbar - Left Rotation 75%                     [facility] Adult PT Treatment/Exercise - 04/22/17 0813      Lumbar Exercises: Stretches   Quadruped Mid Back Stretch 3 reps   Quadruped Mid Back Stretch Limitations prayer stretch forward and side to side     Lumbar Exercises: Supine   Bent Knee Raise 10 reps  x2 to review   Bent Knee Raise Limitations with abdominal draw in   Bridge 10 reps     Modalities   Modalities Traction     Moist Heat Therapy   Number Minutes Moist Heat 15 Minutes   Moist Heat Location Lumbar Spine     Traction   Type of Traction Lumbar   Min (lbs) 45   Max (lbs) 75   Hold Time 60   Rest Time 10  Time 15     Manual Therapy   Manual Therapy Soft tissue mobilization;Myofascial release   Manual therapy comments IR and ER stretch with trigger point release   Joint Mobilization PA lumbar UPA on right L-3 to L-5 and sacrum   Soft tissue mobilization gluteals and piriforumis with IASTYM tool   Myofascial Release right QL          Trigger Point Dry Needling - 04/22/17 0813    Consent Given? Yes   Education Handout Provided --  verbally reminded    Muscles Treated Upper Body Quadratus Lumborum;Gluteus minimus;Gluteus maximus;Piriformis  right side    Gluteus Maximus Response Twitch response elicited;Palpable increased muscle length   Gluteus Minimus Response Twitch response elicited;Palpable increased muscle length   Piriformis Response Twitch response elicited;Palpable increased muscle length                PT Short Term Goals - 04/22/17 [id number]      PT SHORT TERM GOAL #1   Title "Independent with initial HEP   Baseline Moving onto core abdominal exercises today.   Time 4   Period Weeks   Status Achieved     PT SHORT TERM GOAL #2   Title "Report pain  decrease from 8/10 to4 /10.   Baseline Pt is 5/10 today   Time 4   Period Weeks   Status On-going     PT SHORT TERM GOAL #3   Title "Demonstrate understanding of proper sitting posture and be more conscious of position and posture throughout the day.    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    Time 8   Period Weeks   Status New     PT LONG TERM GOAL #2   Title "Pt will be independent with advanced HEP.    Time 8   Period Weeks   Status New     PT LONG TERM GOAL #3   Title "Pt will tolerate sitting 90 minutes without increased pain to ride in car without increased pain   Time 8   Period Weeks   Status New     PT LONG TERM GOAL #4   Title "FOTO will improve from  64% limitation  to  43% limitation    indicating improved functional mobility.    Time 8   Period Weeks   Status New     PT LONG TERM GOAL #5   Title Pt will be able to return to physical activity and be able to run for 1/2 a mile without exacerbating pain in back   Time 8   Period Weeks   Status New     PT LONG TERM GOAL #6   Title "Pt will tolerate walking for 2 hours without increased pain in order to return to PLOF  Time 8   Period Weeks   Status New               Plan - 04/22/17 [id number]    Clinical Impression Statement Pt reports going to the gym and able to use stationary bike for 15 minutes.  Pt understands importance of stregnthening core in order to progress to extremity  strengthening.  Pt FOTO improved to 51% limitation from 64% on eval.  Pt with increased lumbar AROM but with end range pain.  Pt was 5/10 pain level and reducedt to 4/10 at end of treatment. Will continue to progress toward LTG's    Rehab Potential Good   PT Frequency 2x / week   PT Duration 8 weeks   PT Treatment/Interventions ADLs/Self Care Home Management;Taping;Dry needling;Electrical Stimulation;Iontophoresis 4mg /ml Dexamethasone;Cryotherapy;Moist Heat;Traction;Ultrasound;Functional mobility training;Therapeutic activities;Therapeutic exercise;Neuromuscular re-education;Patient/family education;Passive range of motion;Manual techniques   PT Next Visit Plan more TPDN  and traction beneficial.  Pt needs to add to HEP for core progression.   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 prepilates series clams, tabel top with alternating heel taps    Consulted and Agree with Plan of Care Patient      Patient will benefit from skilled therapeutic intervention in order to improve the following deficits and impairments:  Hypomobility, Pain, Impaired flexibility, Increased fascial restricitons, Decreased range of motion, Decreased strength, Decreased mobility, Decreased activity tolerance, Improper body mechanics, Postural dysfunction  Visit Diagnosis: Chronic right-sided low back pain with right-sided sciatica  Muscle spasm of back  Abnormal posture  Muscle weakness (generalized)  Difficulty in walking, not elsewhere classified     Problem List Patient Active Problem List   Diagnosis Date Noted  . DDD (degenerative disc disease), lumbosacral 09/27/2016  . Urinary incontinence 10/09/2015  . Erectile dysfunction 10/09/2015   Garen LahLawrie Beardsley, PT Certified Exercise Expert for the Aging Adult  04/22/17 8:54 AM Phone: [phone] Fax: [phone]  [facility] [address] [place], [place], [address] Phone: [phone]   Fax:  [phone]  Name: Andrew Spearingrnest L Andrew Jacobs MRN: [id number] Date of Birth: [birth year]

## 2017-04-23 ENCOUNTER — Encounter: Payer: Self-pay | Admitting: Physical Therapy

## 2017-04-24 NOTE — Assessment & Plan Note (Addendum)
COntinue meloxicam, PRN cyclobenzaprine and PT. Advised to STOP the ibuprofen and avoid naproxen.

## 2017-04-29 ENCOUNTER — Ambulatory Visit: Payer: 59 | Attending: Physician Assistant | Admitting: Physical Therapy

## 2017-04-29 ENCOUNTER — Encounter: Payer: Self-pay | Admitting: Physical Therapy

## 2017-04-29 DIAGNOSIS — M6281 Muscle weakness (generalized): Secondary | ICD-10-CM

## 2017-04-29 DIAGNOSIS — G8929 Other chronic pain: Secondary | ICD-10-CM | POA: Diagnosis present

## 2017-04-29 DIAGNOSIS — M5441 Lumbago with sciatica, right side: Secondary | ICD-10-CM | POA: Insufficient documentation

## 2017-04-29 DIAGNOSIS — R293 Abnormal posture: Secondary | ICD-10-CM | POA: Insufficient documentation

## 2017-04-29 DIAGNOSIS — M542 Cervicalgia: Secondary | ICD-10-CM | POA: Diagnosis present

## 2017-04-29 DIAGNOSIS — M62830 Muscle spasm of back: Secondary | ICD-10-CM

## 2017-04-29 DIAGNOSIS — R262 Difficulty in walking, not elsewhere classified: Secondary | ICD-10-CM
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
## 2017-04-30 ENCOUNTER — Encounter: Payer: Self-pay | Admitting: Physical Therapy

## 2017-05-09 ENCOUNTER — Encounter: Payer: Self-pay | Admitting: Physical Therapy

## 2017-05-15 ENCOUNTER — Encounter: Payer: Self-pay | Admitting: Physical Therapy

## 2017-05-15 ENCOUNTER — Ambulatory Visit: Payer: 59 | Admitting: Physical Therapy

## 2017-05-15 DIAGNOSIS — M62830 Muscle spasm of back: Secondary | ICD-10-CM

## 2017-05-15 DIAGNOSIS — M6281 Muscle weakness (generalized): Secondary | ICD-10-CM

## 2017-05-15 DIAGNOSIS — M542 Cervicalgia: Secondary | ICD-10-CM

## 2017-05-15 DIAGNOSIS — M5441 Lumbago with sciatica, right side: Secondary | ICD-10-CM | POA: Diagnosis not present

## 2017-05-15 DIAGNOSIS — R293 Abnormal posture: Secondary | ICD-10-CM

## 2017-05-15 DIAGNOSIS — R262 Difficulty in walking, not elsewhere classified: Secondary | ICD-10-CM

## 2017-05-15 DIAGNOSIS — G8929 Other chronic pain: Secondary | ICD-10-CM

## 2017-05-15 NOTE — Therapy (Signed)
[facility] [address] [place], [place], [address] Phone: [phone]   Fax:  [phone]  Physical Therapy Treatment  Patient Details  Name: Andrew Jacobs MRN: [id number] Date of Birth: 1966/08/26 Referring Provider: Theora Gianotti PA  Encounter Date: 05/15/2017      PT End of Session - 05/15/17 1004    Visit Number 8   Number of Visits 15   Date for PT Re-Evaluation 06/24/17   PT Start Time 0935   PT Stop Time 1017   PT Time Calculation (min) 42 min   Activity Tolerance Patient tolerated treatment well   Behavior During Therapy [facility] for tasks assessed/performed      Past Medical History:  Diagnosis Date  . Allergy   . Testicular torsion     Past Surgical History:  Procedure Laterality Date  . KNEE SURGERY    . SHOULDER SURGERY      There were no vitals filed for this visit.      Subjective Assessment - 05/15/17 0936    Subjective "I had training on Friday Afternoon, and I think it aggrivating my back and neck and I am alittle more sore today"    Currently in Pain? Yes   Pain Score 5    Pain Location Back   Pain Orientation Right   Pain Descriptors / Indicators Aching;Sore   Pain Type Chronic pain   Pain Onset More than a month ago   Pain Frequency Intermittent   Aggravating Factors  work related activities   Pain Relieving Factors motrin heat,    Pain Score 5   Pain Location Neck   Pain Orientation Left   Pain Descriptors / Indicators Aching;Sore   Pain Type Chronic pain   Pain Onset More than a month ago   Pain Frequency Constant   Aggravating Factors  turning L    Pain Relieving Factors stop and rest, taping                         [facility] Adult PT Treatment/Exercise - 05/15/17 0942      Lumbar Exercises: Stretches   Active Hamstring Stretch 3 reps;30 seconds  contract/ relax with 10 sec hold     Lumbar Exercises: Aerobic   Elliptical Nu-Step L6 x 5 min UE/LE      Lumbar Exercises: Supine   Straight Leg Raise 15 reps  combined with 30 sec small oscillations at mid range   Straight Leg Raises Limitations x 2 sets     Lumbar Exercises: Sidelying   Other Sidelying Lumbar Exercises book opening 2 x 10     Manual Therapy   Joint Mobilization long axis distraction grade 5, 1st rib mobs on the R grade 3 with pt breathing in and out   Soft tissue mobilization IASTM over the R lumbar spine and L upper trap   Myofascial Release R lumbar spine fascial stretching/ rolling   McConnell L upper trap inhibition taping          Trigger Point Dry Needling - 05/15/17 1006    Consent Given? Yes   Education Handout Provided No  given previously   Muscles Treated Upper Body Longissimus   Longissimus Response Palpable increased muscle length;Twitch response elicited  L3-L5 on the R              PT Education - 05/15/17 1004    Education provided Yes   Education Details updated HEP for thoracic mobility and  1st rib mobility.    Person(s) Educated Patient   Methods Explanation;Verbal cues;Handout   Comprehension Verbalized understanding;Verbal cues required          PT Short Term Goals - 04/29/17 0931      PT SHORT TERM GOAL #1   Title "Independent with initial HEP   Time 4   Period Weeks   Status Achieved     PT SHORT TERM GOAL #2   Title "Report pain decrease from 8/10 to4 /10.   Time 4   Period Weeks   Status On-going     PT SHORT TERM GOAL #3   Title "Demonstrate understanding of proper sitting posture and be more conscious of position and posture throughout the day.    Time 4   Period Weeks   Status Achieved     PT SHORT TERM GOAL #4   Title Pt will be able to tolerate sitting for driving, working for 30 minutes without increase of back pain   Time 4   Period Weeks   Status On-going           PT Long Term Goals - 04/29/17 4696      PT LONG TERM GOAL #1   Title "Demonstrate and verbalize techniques to reduce the risk of  re-injury including: lifting, posture, body mechanics.    Time 8   Period Weeks   Status On-going     PT LONG TERM GOAL #2   Title "Pt will be independent with advanced HEP.    Time 8   Period Weeks   Status On-going     PT LONG TERM GOAL #3   Title "Pt will tolerate sitting 90 minutes without increased pain to ride in car without increased pain   Time 8   Period Weeks   Status On-going     PT LONG TERM GOAL #4   Title "FOTO will improve from  64% limitation  to  43% limitation    indicating improved functional mobility.    Time 8   Period Weeks   Status Unable to assess     PT LONG TERM GOAL #5   Title Pt will be able to return to physical activity and be able to run for 1/2 a mile without exacerbating pain in back   Time 8   Period Weeks   Status On-going     Additional Long Term Goals   Additional Long Term Goals Yes     PT LONG TERM GOAL #6   Title "Pt will tolerate walking for 2 hours without increased pain in order to return to PLOF    Time 8   Period Weeks   Status On-going     PT LONG TERM GOAL #7   Title pt to demonstrate decrease upper trap/ surrounding muscles to decrease pain/ HA and promote cervical mobility    Time 4   Period Weeks   Status New   Target Date 05/27/17     PT LONG TERM GOAL #8   Title pt to increase cervical mobility by >/= 10 degrees  in all planes to promote safety with driving and no report of pain/ HA or Tinnitis   Time 4   Period Weeks   Status New   Target Date 05/27/17               Plan - 05/15/17 1102    Clinical Impression Statement pt reported  increased soreness in the neck and R low back due to  training session at work. continued treatment for R posteriorly rotated innominate with stretching and hip flexor activation which he reported significant improvement in pain. TPDN over the R lumbar paraspinals, and L upper trap. continued inhibition taping followed 1st rib mobs and throacic mobility traing. he declined  modalities post session reported decreased pain .    PT Next Visit Plan work on manual trigger point release and sub-occipital release, thoraicc extnesion , assess response to DN, posteriorly rotataed innominate on R,  core strengthening,    PT Home Exercise Plan child's pose, intial posture sitting and standing. prepilates series clams, tabel top with alternating heel taps, upper trap stretching, levator scap sretch, chin tuck, hamstring stretch, SLR, 1st rib mobs, and thoracic mobility   Consulted and Agree with Plan of Care Patient      Patient will benefit from skilled therapeutic intervention in order to improve the following deficits and impairments:     Visit Diagnosis: Chronic right-sided low back pain with right-sided sciatica  Muscle spasm of back  Abnormal posture  Muscle weakness (generalized)  Difficulty in walking, not elsewhere classified  Cervicalgia     Problem List Patient Active Problem List   Diagnosis Date Noted  . DDD (degenerative disc disease), lumbosacral 09/27/2016  . Urinary incontinence 10/09/2015  . Erectile dysfunction 10/09/2015   Lulu Riding PT, DPT, LAT, ATC  05/15/17  11:05 AM      [facility] [address] [place], [place], [address] Phone: [phone]   Fax:  [phone]  Name: Andrew Jacobs MRN: [id number] Date of Birth: August 14, 1966

## 2017-05-16 DIAGNOSIS — Z0100 Encounter for examination of eyes and vision without abnormal findings: Secondary | ICD-10-CM | POA: Diagnosis not present

## 2017-05-19 ENCOUNTER — Ambulatory Visit: Payer: 59 | Admitting: Physical Therapy

## 2017-06-02 ENCOUNTER — Ambulatory Visit: Payer: 59 | Admitting: Physical Therapy

## 2017-06-02 ENCOUNTER — Other Ambulatory Visit: Payer: Self-pay | Admitting: Physician Assistant

## 2017-06-02 DIAGNOSIS — M5137 Other intervertebral disc degeneration, lumbosacral region: Secondary | ICD-10-CM

## 2017-06-02 DIAGNOSIS — G8929 Other chronic pain: Secondary | ICD-10-CM

## 2017-06-02 DIAGNOSIS — M5442 Lumbago with sciatica, left side: Principal | ICD-10-CM

## 2017-06-09 ENCOUNTER — Ambulatory Visit: Payer: 59 | Attending: Physician Assistant | Admitting: Physical Therapy

## 2017-06-09 ENCOUNTER — Encounter: Payer: Self-pay | Admitting: Physical Therapy

## 2017-06-09 DIAGNOSIS — R262 Difficulty in walking, not elsewhere classified: Secondary | ICD-10-CM | POA: Insufficient documentation

## 2017-06-09 DIAGNOSIS — M62830 Muscle spasm of back: Secondary | ICD-10-CM

## 2017-06-09 DIAGNOSIS — M6281 Muscle weakness (generalized): Secondary | ICD-10-CM | POA: Diagnosis present

## 2017-06-09 DIAGNOSIS — M5441 Lumbago with sciatica, right side: Secondary | ICD-10-CM | POA: Diagnosis not present

## 2017-06-09 DIAGNOSIS — R293 Abnormal posture: Secondary | ICD-10-CM | POA: Insufficient documentation

## 2017-06-09 DIAGNOSIS — G8929 Other chronic pain: Secondary | ICD-10-CM | POA: Diagnosis not present

## 2017-06-09 NOTE — Therapy (Signed)
[facility] [address] [place], [place], [address] Phone: [phone]   Fax:  [phone]  Physical Therapy Treatment  Patient Details  Name: Andrew Jacobs MRN: [id number] Date of Birth: 22-Nov-1965 Referring Provider: Theora Gianotti PA  Encounter Date: 06/09/2017      PT End of Session - 06/09/17 1508    Visit Number 9   Number of Visits 15   Date for PT Re-Evaluation 06/24/17   Authorization Type [employer]   PT Start Time 1508  pt arrived late   PT Stop Time 1540   PT Time Calculation (min) 32 min   Activity Tolerance Patient tolerated treatment well   Behavior During Therapy [facility] for tasks assessed/performed      Past Medical History:  Diagnosis Date  . Allergy   . Testicular torsion     Past Surgical History:  Procedure Laterality Date  . KNEE SURGERY    . SHOULDER SURGERY      There were no vitals filed for this visit.      Subjective Assessment - 06/09/17 1508    Subjective Feels like he has regressed a little. RLE numbness and LBP. Began a couple of days after treatment.    Currently in Pain? Yes   Pain Score 6                          [facility] Adult PT Treatment/Exercise - 06/09/17 0001      Lumbar Exercises: Stretches   Quadruped Mid Back Stretch Limitations cat/camel/child pose     Lumbar Exercises: Standing   Other Standing Lumbar Exercises SLS with abdominal engagement/R hip abductors     Lumbar Exercises: Supine   AB Set Limitations with ball squeeze bw knees; ab sets in supine, seated and standing     Lumbar Exercises: Sidelying   Clam 20 reps;10 reps     Manual Therapy   Soft tissue mobilization R QL, paraspinals, glut max                PT Education - 06/09/17 1542    Education provided Yes   Education Details importance of abdominal engagement, exercise form/rationale, HEP, use of stretches in car to decrease pain   Person(s) Educated Patient   Methods  Explanation;Demonstration;Tactile cues;Verbal cues;Handout   Comprehension Verbalized understanding;Returned demonstration;Verbal cues required;Tactile cues required;Need further instruction          PT Short Term Goals - 04/29/17 0931      PT SHORT TERM GOAL #1   Title "Independent with initial HEP   Time 4   Period Weeks   Status Achieved     PT SHORT TERM GOAL #2   Title "Report pain decrease from 8/10 to4 /10.   Time 4   Period Weeks   Status On-going     PT SHORT TERM GOAL #3   Title "Demonstrate understanding of proper sitting posture and be more conscious of position and posture throughout the day.    Time 4   Period Weeks   Status Achieved     PT SHORT TERM GOAL #4   Title Pt will be able to tolerate sitting for driving, working for 30 minutes without increase of back pain   Time 4   Period Weeks   Status On-going           PT Long Term Goals - 04/29/17 2536      PT LONG TERM GOAL #1  Title "Demonstrate and verbalize techniques to reduce the risk of re-injury including: lifting, posture, body mechanics.    Time 8   Period Weeks   Status On-going     PT LONG TERM GOAL #2   Title "Pt will be independent with advanced HEP.    Time 8   Period Weeks   Status On-going     PT LONG TERM GOAL #3   Title "Pt will tolerate sitting 90 minutes without increased pain to ride in car without increased pain   Time 8   Period Weeks   Status On-going     PT LONG TERM GOAL #4   Title "FOTO will improve from  64% limitation  to  43% limitation    indicating improved functional mobility.    Time 8   Period Weeks   Status Unable to assess     PT LONG TERM GOAL #5   Title Pt will be able to return to physical activity and be able to run for 1/2 a mile without exacerbating pain in back   Time 8   Period Weeks   Status On-going     Additional Long Term Goals   Additional Long Term Goals Yes     PT LONG TERM GOAL #6   Title "Pt will tolerate walking for 2 hours  without increased pain in order to return to PLOF    Time 8   Period Weeks   Status On-going     PT LONG TERM GOAL #7   Title pt to demonstrate decrease upper trap/ surrounding muscles to decrease pain/ HA and promote cervical mobility    Time 4   Period Weeks   Status New   Target Date 05/27/17     PT LONG TERM GOAL #8   Title pt to increase cervical mobility by >/= 10 degrees  in all planes to promote safety with driving and no report of pain/ HA or Tinnitis   Time 4   Period Weeks   Status New   Target Date 05/27/17               Plan - 06/09/17 1540    Clinical Impression Statement Pt denied numbness in RLE following treatment today. Notable tightness in R QL. R-side trendelenburg in gait resulting in tightening of QL. Exercises focused on engagement of abdominal wall as well as R hip abductors.    PT Treatment/Interventions ADLs/Self Care Home Management;Taping;Dry needling;Electrical Stimulation;Iontophoresis /ml Dexamethasone;Cryotherapy;Moist Heat;Traction;Ultrasound;Functional mobility training;Therapeutic activities;Therapeutic exercise;Neuromuscular re-education;Patient/family education;Passive range of motion;Manual techniques   PT Next Visit Plan lumbopelvic strengthening, manual PRN, suboccipital release   PT Home Exercise Plan child's pose, intial posture sitting and standing. prepilates series clams, tabel top with alternating heel taps, upper trap stretching, levator scap sretch, chin tuck, hamstring stretch, SLR, 1st rib mobs, and thoracic mobility; cat/camel/child pose, R SLS with abdominal engagement   Consulted and Agree with Plan of Care Patient      Patient will benefit from skilled therapeutic intervention in order to improve the following deficits and impairments:  Hypomobility, Pain, Impaired flexibility, Increased fascial restricitons, Decreased range of motion, Decreased strength, Decreased mobility, Decreased activity tolerance, Improper body  mechanics, Postural dysfunction  Visit Diagnosis: Chronic right-sided low back pain with right-sided sciatica  Muscle spasm of back  Abnormal posture  Muscle weakness (generalized)  Difficulty in walking, not elsewhere classified     Problem List Patient Active Problem List   Diagnosis Date Noted  . DDD (degenerative disc disease),  lumbosacral 09/27/2016  . Urinary incontinence 10/09/2015  . Erectile dysfunction 10/09/2015   Jaiceon Collister C. Kajah Santizo PT, DPT 06/09/17 3:44 PM   [facility] [address] [place], [place], [address] Phone: [phone]   Fax:  [phone]  Name: Andrew Jacobs MRN: [id number] Date of Birth: 1966/03/07

## 2017-06-10 ENCOUNTER — Ambulatory Visit: Payer: 59 | Admitting: Physical Therapy

## 2017-06-24 ENCOUNTER — Ambulatory Visit: Payer: 59 | Attending: Physician Assistant | Admitting: Physical Therapy

## 2017-06-24 ENCOUNTER — Encounter: Payer: Self-pay | Admitting: Physical Therapy

## 2017-06-24 DIAGNOSIS — M6281 Muscle weakness (generalized): Secondary | ICD-10-CM | POA: Diagnosis present

## 2017-06-24 DIAGNOSIS — R293 Abnormal posture: Secondary | ICD-10-CM | POA: Insufficient documentation

## 2017-06-24 DIAGNOSIS — R262 Difficulty in walking, not elsewhere classified: Secondary | ICD-10-CM | POA: Insufficient documentation

## 2017-06-24 DIAGNOSIS — M542 Cervicalgia: Secondary | ICD-10-CM | POA: Diagnosis present

## 2017-06-24 DIAGNOSIS — M5441 Lumbago with sciatica, right side: Secondary | ICD-10-CM | POA: Diagnosis not present

## 2017-06-24 DIAGNOSIS — G8929 Other chronic pain: Secondary | ICD-10-CM | POA: Diagnosis not present

## 2017-06-24 DIAGNOSIS — M62830 Muscle spasm of back: Secondary | ICD-10-CM | POA: Diagnosis present

## 2017-06-24 NOTE — Therapy (Signed)
[facility] [address] [place], [place], [address] Phone: [phone]   Fax:  [phone]  Physical Therapy Treatment  Patient Details  Name: Andrew Jacobs MRN: [id number] Date of Birth: [birth year] Referring Provider: Theora Gianotti, PA  Encounter Date: 06/24/2017      PT End of Session - 06/24/17 1148    Visit Number 10   Number of Visits 15   Date for PT Re-Evaluation 08/08/17   PT Start Time 1148   PT Stop Time 1223   PT Time Calculation (min) 35 min   Activity Tolerance Patient tolerated treatment well   Behavior During Therapy [facility] for tasks assessed/performed      Past Medical History:  Diagnosis Date  . Allergy   . Testicular torsion     Past Surgical History:  Procedure Laterality Date  . KNEE SURGERY    . SHOULDER SURGERY      There were no vitals filed for this visit.      Subjective Assessment - 06/24/17 1149    Subjective Reports feeling okay, had to work the last couple of days. 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 Overall feels like he has learned techniques to manage pain.    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 Yes   Pain Score 6    Pain Location Back   Pain Orientation Right            [facility] PT Assessment - 06/24/17 0001      Assessment   Medical Diagnosis chronic low back pain with right radiation   Referring Provider Theora Gianotti, PA   Onset Date/Surgical Date 03/12/16   Hand Dominance Right     Precautions   Precautions None     Restrictions   Weight Bearing Restrictions No     AROM   Overall AROM  Other (comment)   AROM Assessment Site Lumbar  lumbar ROM WFL with pain   Cervical Flexion 40   Cervical Extension 30   Cervical - Right Side Bend 30   Cervical - Left Side Bend 20   Cervical - Right Rotation 65   Cervical - Left Rotation 60     Strength   Right Hip Flexion 5/5  pain   Right Hip Extension 4+/5   Right Hip ABduction 5/5  pain   Left Hip Flexion 5/5  pain   Left Hip Extension 4+/5   Left Hip ABduction 5/5     Palpation   Palpation comment TTP to T12-L5 PA                     [facility] Adult PT Treatment/Exercise - 06/24/17 0001      Lumbar Exercises: Supine   Bridge Limitations iso low bridge R clam blue tband                PT Education - 06/24/17 1301    Education provided Yes   Education Details anatomy of condition, POC, exercise form/rationale   Person(s) Educated Patient   Methods Explanation;Demonstration;Tactile cues;Verbal cues;Handout   Comprehension Verbalized understanding;Returned demonstration;Verbal cues required;Tactile cues required;Need further instruction          PT Short Term Goals - 06/24/17 1306      PT SHORT TERM GOAL #1   Title "Independent with initial HEP   Baseline Moving onto core abdominal exercises today.   Time 4  Period Weeks   Status Achieved     PT SHORT TERM GOAL #2   Title "Report pain decrease from 8/10 to4 /10.   Baseline 5-6/10   Status On-going     PT SHORT TERM GOAL #3   Title "Demonstrate understanding of proper sitting posture and be more conscious of position and posture throughout the day.    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    Baseline demo good posture to lower but not to stand   Time 6   Period Weeks   Status On-going   Target Date 08/08/17     PT LONG TERM GOAL #2   Title "Pt will be  independent with advanced HEP.    Baseline progressing as appropriate   Time 6   Period Weeks   Status On-going   Target Date 08/08/17     PT LONG TERM GOAL #3   Title "Pt will tolerate sitting 90 minutes without increased pain to ride in car without increased pain   Baseline 30 min max   Time 6   Period Weeks   Status On-going   Target Date 08/08/17     PT LONG TERM GOAL #4   Title "FOTO will improve from  64% limitation  to  43% limitation    indicating improved functional mobility.    Baseline not taken today   Time 6   Period Weeks   Status Unable to assess   Target Date 08/08/17     PT LONG TERM GOAL #5   Title Pt will be able to return to physical activity and be able to run for 1/2 a mile without exacerbating pain in back   Baseline has not run due to pain   Time 6   Period Weeks   Status On-going   Target Date 08/08/17     PT LONG TERM GOAL #6   Title "Pt will tolerate walking for 2 hours without increased pain in order to return to PLOF    Baseline 15-20 min   Time 6   Period Weeks   Status On-going   Target Date 08/08/17     PT LONG TERM GOAL #7   Title pt to demonstrate decrease upper trap/ surrounding muscles to decrease pain/ HA and promote cervical mobility    Baseline pt reports HA still present, being referred to neurologist   Time 6   Period Weeks   Status On-going   Target Date 08/08/17     PT LONG TERM GOAL #8   Title pt to increase cervical mobility by >/= 10 degrees  in all planes to promote safety with driving and no report of pain/ HA or Tinnitis   Baseline see flowsheet   Time 6   Period Weeks   Status On-going   Target Date 08/08/17               Plan -  06/24/17 1209    Clinical Impression Statement 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 Exercises to focus on activation of external rotators resulted in reported decreased pain. Will cont to benefit from skilled PT in order to  improve lumbopelvic stability to decrease pain and reach long term goals   PT Frequency 2x / week   PT Duration 6 weeks   PT Treatment/Interventions ADLs/Self Care Home Management;Taping;Dry needling;Electrical Stimulation;Iontophoresis /ml Dexamethasone;Cryotherapy;Moist Heat;Traction;Ultrasound;Functional mobility training;Therapeutic activities;Therapeutic exercise;Neuromuscular re-education;Patient/family education;Passive range of motion;Manual techniques   PT Next Visit Plan R hip external rotator activation, reformer   PT Home Exercise Plan child's pose, intial posture sitting and standing. prepilates series clams, tabel top with alternating heel taps, upper trap stretching, levator scap sretch, chin tuck, hamstring stretch, SLR, 1st rib mobs, and thoracic mobility; cat/camel/child pose, R SLS with abdominal engagement; prone R hip ER with glut sets, brige with R clam, stand with R foot slightly back   Consulted and Agree with Plan of Care Patient      Patient will benefit from skilled therapeutic intervention in order to improve the following deficits and impairments:  Hypomobility, Pain, Impaired flexibility, Increased fascial restricitons, Decreased range of motion, Decreased strength, Decreased mobility, Decreased activity tolerance, Improper body mechanics, Postural dysfunction  Visit Diagnosis: Chronic right-sided low back pain with right-sided sciatica - Plan: PT plan of care cert/re-cert  Muscle spasm of back - Plan: PT plan of care cert/re-cert  Abnormal posture - Plan: PT plan of care cert/re-cert  Muscle weakness (generalized) - Plan: PT plan of care cert/re-cert  Difficulty in walking, not elsewhere classified - Plan: PT plan of care cert/re-cert  Cervicalgia - Plan: PT plan of care cert/re-cert     Problem List Patient Active Problem List   Diagnosis Date Noted  . DDD (degenerative disc disease), lumbosacral 09/27/2016  . Urinary incontinence 10/09/2015  .  Erectile dysfunction 10/09/2015   Anslee Micheletti C. Leeland Lovelady PT, DPT 06/24/17 1:09 PM   [facility] [address] [place], [place], [address] Phone: [phone]   Fax:  [phone]  Name: Andrew Jacobs MRN: [id number] Date of Birth: 18-Aug-1966

## 2017-07-02 ENCOUNTER — Other Ambulatory Visit: Payer: Self-pay | Admitting: Physician Assistant

## 2017-07-02 ENCOUNTER — Encounter: Payer: Self-pay | Admitting: Physical Therapy

## 2017-07-02 DIAGNOSIS — M5442 Lumbago with sciatica, left side: Principal | ICD-10-CM

## 2017-07-02 DIAGNOSIS — M5137 Other intervertebral disc degeneration, lumbosacral region: Secondary | ICD-10-CM

## 2017-07-02 DIAGNOSIS — G8929 Other chronic pain: Secondary | ICD-10-CM

## 2017-07-08 ENCOUNTER — Ambulatory Visit: Payer: 59 | Admitting: Physical Therapy

## 2017-07-08 ENCOUNTER — Ambulatory Visit (INDEPENDENT_AMBULATORY_CARE_PROVIDER_SITE_OTHER): Payer: 59 | Admitting: Physician Assistant

## 2017-07-08 ENCOUNTER — Encounter: Payer: Self-pay | Admitting: Physician Assistant

## 2017-07-08 ENCOUNTER — Encounter: Payer: Self-pay | Admitting: Physical Therapy

## 2017-07-08 VITALS — BP 124/92 | HR 78 | Temp 98.6°F | Resp 18 | Ht 72.0 in | Wt 203.6 lb

## 2017-07-08 DIAGNOSIS — M62830 Muscle spasm of back: Secondary | ICD-10-CM

## 2017-07-08 DIAGNOSIS — M5441 Lumbago with sciatica, right side: Secondary | ICD-10-CM | POA: Diagnosis not present

## 2017-07-08 DIAGNOSIS — M6281 Muscle weakness (generalized): Secondary | ICD-10-CM

## 2017-07-08 DIAGNOSIS — R293 Abnormal posture: Secondary | ICD-10-CM

## 2017-07-08 DIAGNOSIS — M5137 Other intervertebral disc degeneration, lumbosacral region: Secondary | ICD-10-CM | POA: Diagnosis not present

## 2017-07-08 DIAGNOSIS — G8929 Other chronic pain: Secondary | ICD-10-CM

## 2017-07-08 DIAGNOSIS — R262 Difficulty in walking, not elsewhere classified: Secondary | ICD-10-CM

## 2017-07-08 MED ORDER — PREDNISONE 20 MG PO TABS: ORAL_TABLET | ORAL | 0 refills | Status: AC

## 2017-07-08 NOTE — Patient Instructions (Addendum)
STOP the meloxicam while you are taking the prednisone. When you complete the prednisone taper, RESUME the meloxicam. If the prednisone doesn't help, or the pain recurs after completing it, we'll plan to get an MRI of the low back.    IF you received an x-ray today, you will receive an invoice from [facility] Radiology. Please contact [facility] at [phone] with questions or concerns regarding your invoice.   IF you received labwork today, you will receive an invoice from [facility]. Please contact [facility] at [phone] with questions or concerns regarding your invoice.   Our billing staff will not be able to assist you with questions regarding bills from these companies.  You will be contacted with the lab results as soon as they are available. The fastest way to get your results is to activate your My Chart account. Instructions are located on the last page of this paperwork. If you have not heard from [place] regarding the results in 2 weeks, please contact this office.

## 2017-07-08 NOTE — Assessment & Plan Note (Signed)
Acute on chronic pain. Continue cyclobenzaprine at HS. Hold meloxicam for prednisone taper. If no improvement, plan MRI.

## 2017-07-08 NOTE — Therapy (Addendum)
[facility] [address] [place], [place], [address] Phone: [phone]   Fax:  [phone]  Physical Therapy Treatment  Patient Details  Name: Andrew Jacobs MRN: [id number] Date of Birth: 1965-11-08 Referring Provider: Theora Gianotti, PA  Encounter Date: 07/08/2017      PT End of Session - 07/08/17 1145    Visit Number 11   Number of Visits 15   Date for PT Re-Evaluation 08/08/17   Authorization Type [employer]   PT Start Time 1017   PT Stop Time 1120   PT Time Calculation (min) 63 min   Activity Tolerance Patient limited by pain   Behavior During Therapy Restless      Past Medical History:  Diagnosis Date  . Allergy   . Testicular torsion     Past Surgical History:  Procedure Laterality Date  . KNEE SURGERY    . SHOULDER SURGERY      There were no vitals filed for this visit.      Subjective Assessment - 07/08/17 1025    Subjective I bent over last Friday Oct 12th to pick up a napkin and I strained my back.  I have tried every thing.  I would like to try needling to try to get ahead of the spasm.     Pertinent History in the past excellent physcial condition. and no medical issues   Limitations Sitting;Standing;Walking;House hold activities  participating in work calisthenics   How long can you sit comfortably? < 5 min   Currently in Pain? Yes   Pain Score 9    Pain Location Back   Pain Orientation Right   Pain Descriptors / Indicators Aching;Spasm;Shooting   Pain Type Acute pain   Pain Onset In the past 7 days   Pain Frequency Constant   Aggravating Factors  picked up a napkin on the ground on Friday            [facility] PT Assessment - 07/08/17 1043      Posture/Postural Control   Posture/Postural Control Postural limitations     Pt in acute spasm, 9/10 , relieved to 5-6/10 with PT RX but returned to 9/10 with transitional movements supine to sit.                [facility] Adult PT  Treatment/Exercise - 07/08/17 1043      Transfers   Transfers Supine to Sit  Pt requiring moderate assistance due to right back spasm     Posture/Postural Control   Posture Comments Right QL significantly elevated 1 cm above left QL line   in acute spasm     Self-Care   Self-Care Posture;Other Self-Care Comments   Lifting Discussed proper posture /body mechanics in reaching for items on floor in order to brace torso for lifting   Other Self-Care Comments  techniques for relieving spasms     Lumbar Exercises: Standing   Other Standing Lumbar Exercises attempted back extension and right side glide repetitive motion but pt could not tolerate  x 3 each     Moist Heat Therapy   Number Minutes Moist Heat 15 Minutes   Moist Heat Location Lumbar Spine     Electrical Stimulation   Electrical Stimulation Location lumbar   Electrical Stimulation Action IFC   Electrical Stimulation Parameters 13 ma   Electrical Stimulation Goals Pain     Manual Therapy   Soft tissue mobilization R QL, paraspinals, IASTYM tool   Myofascial Release Right QL  Trigger Point Dry Needling - 07/08/17 1141    Consent Given? Yes   Education Handout Provided --  verbally   Muscles Treated Upper Body Longissimus;Quadratus Lumborum  Right QL twitch marked and palpable lengthening   Longissimus Response Palpable increased muscle length  during treatment L-1 to L5 lumbosacral              PT Education - 07/08/17 1146    Education provided Yes   Education Details Verbally discussed techniques to relieve spasms and reviewed proper posture and lifting   Person(s) Educated Patient   Methods Explanation   Comprehension Verbalized understanding          PT Short Term Goals - 06/24/17 1306      PT SHORT TERM GOAL #1   Title "Independent with initial HEP   Baseline Moving onto core abdominal exercises today.   Time 4   Period Weeks   Status Achieved     PT SHORT TERM GOAL #2   Title  "Report pain decrease from 8/10 to4 /10.   Baseline 5-6/10   Status On-going     PT SHORT TERM GOAL #3   Title "Demonstrate understanding of proper sitting posture and be more conscious of position and posture throughout the day.    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    Baseline demo good posture to lower but not to stand   Time 6   Period Weeks   Status On-going   Target Date 08/08/17     PT LONG TERM GOAL #2   Title "Pt will be independent with advanced HEP.    Baseline progressing as appropriate   Time 6   Period Weeks   Status On-going   Target Date 08/08/17     PT LONG TERM GOAL #3   Title "Pt will tolerate sitting 90 minutes without increased pain to ride in car without increased pain   Baseline 30 min max   Time 6   Period Weeks   Status On-going   Target Date 08/08/17     PT LONG TERM GOAL #4   Title "FOTO will improve from  64% limitation  to  43% limitation    indicating improved functional mobility.    Baseline not taken today   Time 6   Period Weeks   Status Unable to assess   Target Date 08/08/17     PT LONG TERM GOAL #5   Title Pt will be able to return to physical activity and be able to run for 1/2 a mile without exacerbating pain in back   Baseline has not run due to pain   Time 6   Period Weeks   Status On-going   Target Date 08/08/17     PT LONG TERM GOAL #6   Title "Pt will tolerate walking for 2 hours without increased pain in order to return to PLOF  Baseline 15-20 min    Time 6   Period Weeks   Status On-going   Target Date 08/08/17     PT LONG TERM GOAL #7   Title pt to demonstrate decrease upper trap/ surrounding muscles to decrease pain/ HA and promote cervical mobility    Baseline pt reports HA still present, being referred to neurologist   Time 6   Period Weeks   Status On-going   Target Date 08/08/17     PT LONG TERM GOAL #8   Title pt to increase cervical mobility by >/= 10 degrees  in all planes to promote safety with driving and no report of pain/ HA or Tinnitis   Baseline see flowsheet   Time 6   Period Weeks   Status On-going   Target Date 08/08/17               Plan - 07/08/17 1108    Clinical Impression Statement Pt enters clinic with right low back spasm with Right Quadratus Lumborum clearly elevated and in spasm.  pt reports he picked up napkin on Friday and had sudden pain in low back with right radiating pain.  Pt was 9/10 entering clinic.  Pt then requesteed trigger point dry needling and initially relieving pain  5-6/10.  As patient was manuevering into position for e stim pt had increased muscle spasm in right  low back and extending radiation to ankle.  Pt unable to tolerated standing extension or right side bend.  Pt finished treatment with e stim and heat on back with knees in hooklying position.  Pt sat up and patient continued to have spasm. Pt required help to come from supine to sit to minimize spasm in   Mr Paddock was recommended to seek additional care with his primary MD for further work up and may need an additional PT order to continue therapy. Pt was asked if he needed help to drive vehicle which he declined.  Pt reported he would immediately drive to primary care  after PT RX.  [facility] was contacted on Mr. Cuthbertsons behalf to alert providers for his need to be seen quickly.   Rehab Potential Good   PT Frequency 2x / week   PT Duration 6 weeks   PT Treatment/Interventions ADLs/Self Care Home  Management;Taping;Dry needling;Electrical Stimulation;Iontophoresis /ml Dexamethasone;Cryotherapy;Moist Heat;Traction;Ultrasound;Functional mobility training;Therapeutic activities;Therapeutic exercise;Neuromuscular re-education;Patient/family education;Passive range of motion;Manual techniques   PT Next Visit Plan Assess direction of further PT for back after MD evaluation 07-08-17   PT Home Exercise Plan child's pose, intial posture sitting and standing. prepilates series clams, tabel top with alternating heel taps, upper trap stretching, levator scap sretch, chin tuck, hamstring stretch, SLR, 1st rib mobs, and thoracic mobility; cat/camel/child pose, R SLS with abdominal engagement; prone R hip ER with glut sets, brige with R clam, stand with R foot slightly back   Consulted and Agree with Plan of Care Patient      Patient will benefit from skilled therapeutic intervention in order to improve the following deficits and impairments:  Hypomobility, Pain, Impaired flexibility, Increased fascial restricitons, Decreased range of motion, Decreased strength, Decreased mobility, Decreased activity tolerance, Improper body mechanics, Postural dysfunction  Visit Diagnosis: Chronic right-sided low back pain with right-sided sciatica  Muscle spasm of back  Abnormal posture  Muscle weakness (generalized)  Difficulty in walking, not elsewhere classified     Problem List Patient Active Problem List   Diagnosis Date Noted  . DDD (degenerative disc disease), lumbosacral 09/27/2016  .  Urinary incontinence 10/09/2015  . Erectile dysfunction 10/09/2015  Garen Lah, PT Certified Exercise Expert for the Aging Adult  07/08/17 12:05 PM Phone: [phone] Fax: [phone]  [facility] [address] [place], [place], [address] Phone: [phone]   Fax:  [phone]  Name: Andrew Jacobs MRN: [id number] Date of Birth:  May 30, 1966

## 2017-07-08 NOTE — Progress Notes (Signed)
Patient ID: Andrew Jacobs, male    DOB: 1966-06-25, 51 y.o.   MRN: [id number]  PCP: Veryl Speak, FNP  Chief Complaint  Patient presents with  . Back Pain    x4 days, Pt states on Friday he dropped something and after he picked it up his back has been bothering him and his PT thinks his back might be inflamed.  . Follow-up    Subjective:   Presents for evaluation of acute on chronic back pain.  He's had chronic low back pain, for which he has been taking meloxicam and cyclobenzaprine and seeing PT since 01/2017.  On 07/04/2017 he leaned over to pick up an napkin from the floor and developed terrible pain in the middle of the low back that radiates into the RIGHT leg and extends down the leg to the ankle. No loss of bowel or bladder control. No saddle anesthesia. No LE weakness. No numbness or tingling. The pain started to improve until this morning at PT when it worsened again. His therapist called the office to alert [place] of his need for re-evalaution.  He is extremely frustrated by the constant pain he experiences, and by the worsening today. 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 Plain films of the lumbar spine performed here 09/27/2016. CT of the c-spine was 02/2016 in the ED.    Review of Systems As above    Patient Active Problem List   Diagnosis Date Noted  . DDD (degenerative disc disease), lumbosacral 09/27/2016  . Urinary incontinence 10/09/2015  . Erectile dysfunction 10/09/2015     Prior to Admission medications   Medication Sig Start Date End Date Taking? Authorizing Provider  cyclobenzaprine (FLEXERIL) 10 MG tablet TAKE 1 TABLET BY MOUTH AT BEDTIME AS NEEDED FOR MUSCLE SPASMS. 06/03/17  Yes Faith Branan, PA-C  meloxicam (MOBIC) 15 MG tablet TAKE 1 TABLET BY MOUTH EVERY DAY 07/02/17  Yes Shlok Raz, PA-C     No Known Allergies     Objective:  Physical Exam  Constitutional: He is oriented to person, place, and time. He  appears well-developed and well-nourished. He is active and cooperative. No distress.  BP (!) 124/92 (BP Location: Left Arm, Patient Position: Sitting, Cuff Size: Normal)   Pulse 78   Temp 98.6 F (37 C) (Oral)   Resp 18   Ht 6' (1.829 m)   Wt 203 lb 9.6 oz (92.4 kg)   SpO2 98%   BMI 27.61 kg/m   HENT:  Head: Normocephalic and atraumatic.  Right Ear: Hearing normal.  Left Ear: Hearing normal.  Eyes: Conjunctivae are normal. No scleral icterus.  Neck: Normal range of motion. Neck supple. No thyromegaly present.  Cardiovascular: Normal rate, regular rhythm and normal heart sounds.   Pulses:      Radial pulses are 2+ on the right side, and 2+ on the left side.  Pulmonary/Chest: Effort normal and breath sounds normal.  Musculoskeletal:       Cervical back: Normal.       Thoracic back: Normal.       Lumbar back: He exhibits decreased range of motion, tenderness, bony tenderness and pain. He exhibits no swelling, no edema, no deformity, no laceration, no spasm and normal pulse.  Lymphadenopathy:       Head (right side): No tonsillar, no preauricular, no posterior auricular and no occipital adenopathy present.       Head (left side): No tonsillar, no preauricular, no posterior auricular and no  occipital adenopathy present.    He has no cervical adenopathy.       Right: No supraclavicular adenopathy present.       Left: No supraclavicular adenopathy present.  Neurological: He is alert and oriented to person, place, and time. He has normal strength. No sensory deficit.  Unable to elicit more than trace reflexes at the patella and achilles, though this is symmetric.  Skin: Skin is warm, dry and intact. No rash noted. No cyanosis or erythema. Nails show no clubbing.  Psychiatric: He has a normal mood and affect. His speech is normal and behavior is normal.      Assessment & Plan:   Problem List Items Addressed This Visit    DDD (degenerative disc disease), lumbosacral - Primary    Acute  on chronic pain. Continue cyclobenzaprine at HS. Hold meloxicam for prednisone taper. If no improvement, plan MRI.      Relevant Medications   predniSONE (DELTASONE) 20 MG tablet    Other Visit Diagnoses    Acute midline low back pain with right-sided sciatica       see above.   Relevant Medications   predniSONE (DELTASONE) 20 MG tablet       No Follow-up on file.   Fernande Bras, PA-C Primary Care at [facility]

## 2017-07-09 ENCOUNTER — Ambulatory Visit: Payer: 59 | Admitting: Physical Therapy

## 2017-07-17 ENCOUNTER — Encounter: Payer: Self-pay | Admitting: Physical Therapy

## 2017-07-17 ENCOUNTER — Ambulatory Visit: Payer: 59 | Admitting: Physical Therapy

## 2017-07-17 DIAGNOSIS — M5441 Lumbago with sciatica, right side: Principal | ICD-10-CM

## 2017-07-17 DIAGNOSIS — R293 Abnormal posture: Secondary | ICD-10-CM

## 2017-07-17 DIAGNOSIS — G8929 Other chronic pain: Secondary | ICD-10-CM

## 2017-07-17 DIAGNOSIS — M62830 Muscle spasm of back: Secondary | ICD-10-CM

## 2017-07-17 DIAGNOSIS — M6281 Muscle weakness (generalized): Secondary | ICD-10-CM

## 2017-07-17 NOTE — Therapy (Signed)
[facility] [address] [place], [place], [address] Phone: [phone]   Fax:  [phone]  Physical Therapy Treatment  Patient Details  Name: Andrew Jacobs DOBLER MRN: [id number] Date of Birth: 07/26/1966 Referring Provider: Theora Gianotti, PA  Encounter Date: 07/17/2017      PT End of Session - 07/17/17 1155    Visit Number 12   Number of Visits 15   Date for PT Re-Evaluation 08/08/17   Authorization Type UHC   PT Start Time 1150   PT Stop Time 1226   PT Time Calculation (min) 36 min   Activity Tolerance Patient tolerated treatment well   Behavior During Therapy [facility] for tasks assessed/performed      Past Medical History:  Diagnosis Date  . Allergy   . Testicular torsion     Past Surgical History:  Procedure Laterality Date  . KNEE SURGERY    . SHOULDER SURGERY      There were no vitals filed for this visit.      Subjective Assessment - 07/17/17 1153    Subjective Had to take a week off of work due to back pain. Feels like he got 2-3 shots on R side of back. Feels like the leftover pain today. Began doing some stretches and light movements on Monday.   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 Yes   Pain Score 5    Pain Location Back   Pain Orientation Left;Lower   Pain Descriptors / Indicators Sore                         [facility] Adult PT Treatment/Exercise - 07/17/17 0001      Traction   Type of Traction Lumbar   Min (lbs) 45   Max (lbs) 75   Hold Time 60   Rest Time 10   Time 15                PT Education - 07/17/17 1205    Education provided Yes   Education Details discussion of pain levels and plan of care, rationale for further imaging   Person(s) Educated Patient   Methods Explanation   Comprehension Verbalized understanding          PT Short Term Goals - 06/24/17 1306      PT  SHORT TERM GOAL #1   Title "Independent with initial HEP   Baseline Moving onto core abdominal exercises today.   Time 4   Period Weeks   Status Achieved     PT SHORT TERM GOAL #2   Title "Report pain decrease from 8/10 to4 /10.   Baseline 5-6/10   Status On-going     PT SHORT TERM GOAL #3   Title "Demonstrate understanding of proper sitting posture and be more conscious of position and posture throughout the day.    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    Baseline demo good posture to lower but not to stand   Time 6   Period Weeks   Status On-going   Target Date 08/08/17     PT LONG TERM GOAL #2   Title "Pt will be independent with advanced HEP.    Baseline progressing as appropriate   Time 6   Period Weeks   Status On-going   Target Date 08/08/17     PT LONG TERM GOAL #3   Title "Pt will tolerate sitting 90 minutes without increased pain to ride in car without increased pain   Baseline 30 min max   Time 6   Period Weeks   Status On-going   Target Date 08/08/17     PT LONG TERM GOAL #4   Title "FOTO will improve from  64% limitation  to  43% limitation    indicating improved functional mobility.    Baseline not taken today   Time 6   Period Weeks   Status Unable to assess   Target Date 08/08/17     PT LONG TERM GOAL #5   Title Pt will be able to return to physical activity and be able to run for 1/2 a mile without exacerbating pain in back   Baseline has not run due to pain   Time 6   Period Weeks    Status On-going   Target Date 08/08/17     PT LONG TERM GOAL #6   Title "Pt will tolerate walking for 2 hours without increased pain in order to return to PLOF    Baseline 15-20 min   Time 6   Period Weeks   Status On-going   Target Date 08/08/17     PT LONG TERM GOAL #7   Title pt to demonstrate decrease upper trap/ surrounding muscles to decrease pain/ HA and promote cervical mobility    Baseline pt reports HA still present, being referred to neurologist   Time 6   Period Weeks   Status On-going   Target Date 08/08/17     PT LONG TERM GOAL #8   Title pt to increase cervical mobility by >/= 10 degrees  in all planes to promote safety with driving and no report of pain/ HA or Tinnitis   Baseline see flowsheet   Time 6   Period Weeks   Status On-going   Target Date 08/08/17               Plan - 07/17/17 1206    Clinical Impression Statement Pt has had a significant decrease in pain with medications and gentle stretching, continued soreness as expected. Utilized traction today for gentle long duration stretching and will do full re evaluation at next visit.    PT Treatment/Interventions ADLs/Self Care Home Management;Taping;Dry needling;Electrical Stimulation;Iontophoresis 4mg /ml Dexamethasone;Cryotherapy;Moist Heat;Traction;Ultrasound;Functional mobility training;Therapeutic activities;Therapeutic exercise;Neuromuscular re-education;Patient/family education;Passive range of motion;Manual techniques   PT Next Visit Plan re-evaluation of strength/ROM   PT Home Exercise Plan child's pose, intial posture sitting and standing. prepilates series clams, tabel top with alternating heel taps, upper trap stretching, levator scap sretch, chin tuck, hamstring stretch, SLR, 1st rib mobs, and thoracic mobility; cat/camel/child pose, R SLS with abdominal engagement; prone R hip ER with glut sets, brige with R clam, stand with R foot slightly back   Consulted and Agree with Plan of Care  Patient      Patient will  benefit from skilled therapeutic intervention in order to improve the following deficits and impairments:  Hypomobility, Pain, Impaired flexibility, Increased fascial restricitons, Decreased range of motion, Decreased strength, Decreased mobility, Decreased activity tolerance, Improper body mechanics, Postural dysfunction  Visit Diagnosis: Chronic right-sided low back pain with right-sided sciatica  Muscle spasm of back  Abnormal posture  Muscle weakness (generalized)     Problem List Patient Active Problem List   Diagnosis Date Noted  . DDD (degenerative disc disease), lumbosacral 09/27/2016  . Urinary incontinence 10/09/2015  . Erectile dysfunction 10/09/2015   Chidubem Chaires C. Tacori Kvamme PT, DPT 07/17/17 12:28 PM   [facility] [address] [place], [place], [address] Phone: [phone]   Fax:  [phone]  Name: Rikki Spearingrnest L Stahly MRN: [id number] Date of Birth: [birth year]

## 2017-07-24 ENCOUNTER — Encounter: Payer: Self-pay | Admitting: Physical Therapy

## 2017-07-24 ENCOUNTER — Ambulatory Visit: Payer: 59 | Attending: Family | Admitting: Physical Therapy

## 2017-07-24 DIAGNOSIS — M542 Cervicalgia: Secondary | ICD-10-CM | POA: Diagnosis present

## 2017-07-24 DIAGNOSIS — M6281 Muscle weakness (generalized): Secondary | ICD-10-CM | POA: Insufficient documentation

## 2017-07-24 DIAGNOSIS — R262 Difficulty in walking, not elsewhere classified: Secondary | ICD-10-CM | POA: Diagnosis present

## 2017-07-24 DIAGNOSIS — M5441 Lumbago with sciatica, right side: Secondary | ICD-10-CM | POA: Insufficient documentation

## 2017-07-24 DIAGNOSIS — G8929 Other chronic pain: Secondary | ICD-10-CM | POA: Diagnosis not present

## 2017-07-24 DIAGNOSIS — R293 Abnormal posture: Secondary | ICD-10-CM | POA: Insufficient documentation

## 2017-07-24 DIAGNOSIS — M62830 Muscle spasm of back: Secondary | ICD-10-CM
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
## 2017-07-24 NOTE — Therapy (Addendum)
[place], [place], [address] Phone: [phone]   Fax:  [phone]  Physical Therapy Treatment  Patient Details  Name: Andrew Jacobs MRN: [id number] Date of Birth: 05-16-1966 Referring Provider: Daphane Shepherd, PA  Encounter Date: 07/24/2017      PT End of Session - 07/24/17 1240    Visit Number 13   Number of Visits 15   Date for PT Re-Evaluation 08/08/17   Authorization Type UHC   Authorization Time Period 04-22-17   PT Start Time 1151   PT Stop Time 1249   PT Time Calculation (min) 58 min   Activity Tolerance Patient tolerated treatment well   Behavior During Therapy [facility] for tasks assessed/performed      Past Medical History:  Diagnosis Date  . Allergy   . Testicular torsion     Past Surgical History:  Procedure Laterality Date  . KNEE SURGERY    . SHOULDER SURGERY      There were no vitals filed for this visit.      Subjective Assessment - 07/24/17 1155    Subjective I am at a 5/10.  I was put on a steroid pack and it made a difference and I try to not use medications   Pertinent History in the past excellent physcial condition. 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 Yes   Pain Score 5    Pain Location Back   Pain Orientation Lower;Right   Pain Descriptors / Indicators Sore   Pain Type Acute pain   Pain Onset 1 to 4 weeks ago   Pain Frequency Constant   Pain Location Neck   Pain Orientation Left                         [facility] Adult PT Treatment/Exercise - 07/24/17 1210      Transfers   Transfers --     Posture/Postural Control   Posture/Postural Control --   Posture Comments --     Self-Care   Self-Care --   Lifting --   Other Self-Care Comments  --     Lumbar Exercises: Stretches    Quadruped Mid Back Stretch Limitations cat/camel/child pose     Lumbar Exercises: Standing   Other Standing Lumbar Exercises SLS with abdominal engagement/R hip abductors     Lumbar Exercises: Supine   AB Set Limitations with ball squeeze bw knees; ab sets in supine, seated and standing   Bridge Limitations ball squeeze bil bridge    Other Supine Lumbar Exercises single limb bridge right and then left 20 times  no increase in pain     Lumbar Exercises: Sidelying   Clam 20 reps   Clam Limitations blue t band     Moist Heat Therapy   Number Minutes Moist Heat 15 Minutes   Moist Heat Location Lumbar Spine     Electrical Stimulation   Electrical Stimulation Location lumbar   Electrical Stimulation Goals Pain     Traction   Type of Traction Lumbar   Min (lbs) 60   Max (lbs) 100   Hold Time 60   Rest Time 10   Time 15     Manual Therapy   Manual therapy comments MET for right hip ,with hip add/abd and then hip distraction provides  brief relief, PROM.   Pain with IR of hip   Soft tissue mobilization --   Myofascial Release --                  PT Short Term Goals - 06/24/17 1306      PT SHORT TERM GOAL #1   Title "Independent with initial HEP   Baseline Moving onto core abdominal exercises today.   Time 4   Period Weeks   Status Achieved     PT SHORT TERM GOAL #2   Title "Report pain decrease from 8/10 to4 /10.   Baseline 5-6/10   Status On-going     PT SHORT TERM GOAL #3   Title "Demonstrate understanding of proper sitting posture and be more conscious of position and posture throughout the day.    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    Baseline demo good posture to lower but not to stand   Time 6   Period Weeks   Status On-going   Target Date 08/08/17     PT LONG TERM GOAL #2   Title "Pt will be independent with advanced HEP.    Baseline progressing as appropriate   Time 6   Period Weeks   Status On-going   Target Date 08/08/17     PT LONG TERM GOAL #3   Title "Pt will tolerate sitting 90 minutes without increased pain to ride in car without increased pain   Baseline 30 min max   Time 6   Period Weeks   Status On-going   Target Date 08/08/17     PT LONG TERM GOAL #4   Title "FOTO will improve from  64% limitation  to  43% limitation    indicating improved functional mobility.    Baseline not taken today   Time 6   Period Weeks   Status Unable to assess   Target Date 08/08/17     PT LONG TERM GOAL #5   Title Pt will be able to return to physical activity and be able to run for 1/2 a mile without exacerbating pain in back   Baseline has not run due to pain   Time 6   Period Weeks   Status On-going   Target Date 08/08/17     PT LONG TERM GOAL #6   Title "Pt will tolerate walking for 2 hours without increased pain in order to return to PLOF    Baseline 15-20 min   Time 6   Period Weeks   Status On-going   Target Date 08/08/17     PT LONG TERM GOAL #7   Title pt to demonstrate decrease upper trap/ surrounding muscles to decrease pain/ HA and promote cervical mobility    Baseline pt reports HA still present, being referred to neurologist   Time 6   Period Weeks   Status On-going   Target Date  08/08/17     PT LONG TERM GOAL #8   Title pt to increase cervical mobility by >/= 10 degrees  in all planes to promote safety with driving and no report of pain/ HA or Tinnitis   Baseline see flowsheet   Time 6   Period Weeks   Status On-going   Target  Date 08/08/17               Plan - 07/24/17 1240    Clinical Impression Statement Pt has continued constant pain in right radiculopathy.  5/10  Andrew Jacobs is discouraged about back not making progress and attending PT since May. Pt is fearful and resistant to do any extension exercises or movement due to pain.   He had a recent flare that responded to steroid pack but he prefers not to take medication.   Pt was able to do basic exercises today without increasing pain but pain is still 5/10.  Muscle energy techniques provided momentary relieft but nothing long lasting.  Pt will try to be consistent with rest of appt but he is ready to pursue further evaluation with a specialist if he is not much better.     PT Frequency 2x / week   PT Duration 6 weeks   PT Treatment/Interventions ADLs/Self Care Home Management;Taping;Dry needling;Electrical Stimulation;Iontophoresis 1m/ml Dexamethasone;Cryotherapy;Moist Heat;Traction;Ultrasound;Functional mobility training;Therapeutic activities;Therapeutic exercise;Neuromuscular re-education;Patient/family education;Passive range of motion;Manual techniques   PT Next Visit Plan re-evaluation of strength/ROM next visit /discuss plan of care for remaining time   PT Home Exercise Plan child's pose, intial posture sitting and standing. prepilates series clams, tabel top with alternating heel taps, upper trap stretching, levator scap sretch, chin tuck, hamstring stretch, SLR, 1st rib mobs, and thoracic mobility; cat/camel/child pose, R SLS with abdominal engagement; prone R hip ER with glut sets, brige with R clam, stand with R foot slightly back   Consulted and Agree with Plan of Care Patient      Patient will benefit from skilled therapeutic intervention in order to improve the following deficits and impairments:  Hypomobility, Pain, Impaired flexibility, Increased fascial restricitons, Decreased range of motion, Decreased strength, Decreased mobility,  Decreased activity tolerance, Improper body mechanics, Postural dysfunction  Visit Diagnosis: Chronic right-sided low back pain with right-sided sciatica  Muscle spasm of back  Abnormal posture  Muscle weakness (generalized)  Difficulty in walking, not elsewhere classified  Cervicalgia     Problem List Patient Active Problem List   Diagnosis Date Noted  . DDD (degenerative disc disease), lumbosacral 09/27/2016  . Urinary incontinence 10/09/2015  . Erectile dysfunction 10/09/2015    LVoncille Lo PT Certified Exercise Expert for the Aging Adult  07/24/17 12:55 PM Phone: [phone]: 3[facility] [address]Phone: [phone]  Fax:  [phone] Name: Andrew CROCHETMRN: [id number] of Birth: [birth year]

## 2017-08-01 ENCOUNTER — Ambulatory Visit: Payer: 59 | Admitting: Physical Therapy

## 2017-08-01 ENCOUNTER — Encounter: Payer: Self-pay | Admitting: Physical Therapy

## 2017-08-01 DIAGNOSIS — R262 Difficulty in walking, not elsewhere classified: Secondary | ICD-10-CM

## 2017-08-01 DIAGNOSIS — M62830 Muscle spasm of back: Secondary | ICD-10-CM

## 2017-08-01 DIAGNOSIS — M6281 Muscle weakness (generalized): Secondary | ICD-10-CM

## 2017-08-01 DIAGNOSIS — M5441 Lumbago with sciatica, right side: Principal | ICD-10-CM

## 2017-08-01 DIAGNOSIS — G8929 Other chronic pain: Secondary | ICD-10-CM

## 2017-08-01 DIAGNOSIS — R293 Abnormal posture: Secondary | ICD-10-CM

## 2017-08-01 NOTE — Therapy (Signed)
[place], [place], [address] Phone: [phone]   Fax:  [phone]  Physical Therapy Treatment/Discharge Summary  Patient Details  Name: Andrew Jacobs MRN: [id number] Date of Birth: May 03, 1966 Referring Provider: Daphane Shepherd, PA   Encounter Date: 08/01/2017  PT End of Session - 08/01/17 0941    Visit Number  14    Number of Visits  15    Date for PT Re-Evaluation  08/08/17    Authorization Type  UHC    Authorization Time Period  04-22-17    PT Start Time  0935    PT Stop Time  0941    PT Time Calculation (min)  6 min    Behavior During Therapy  [facility] for tasks assessed/performed       Past Medical History:  Diagnosis Date  . Allergy   . Testicular torsion     Past Surgical History:  Procedure Laterality Date  . KNEE SURGERY    . SHOULDER SURGERY      There were no vitals filed for this visit.  Subjective Assessment - 08/01/17 0941    Subjective  PT has helped but has not created a long term change and is still significantly limited from PLOF.                                 PT Short Term Goals - 06/24/17 1306      PT SHORT TERM GOAL #1   Title  "Independent with initial HEP    Baseline  Moving onto core abdominal exercises today.    Time  4    Period  Weeks    Status  Achieved      PT SHORT TERM GOAL #2   Title  "Report pain decrease from 8/10 to4 /10.    Baseline  5-6/10    Status  On-going      PT SHORT TERM GOAL #3   Title  "Demonstrate understanding of proper sitting posture and be more conscious of position and posture throughout the day.     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     Baseline  demo good posture to lower but not to stand    Time  6    Period  Weeks    Status  On-going    Target Date  08/08/17      PT LONG TERM GOAL #2   Title  "Pt will be independent with advanced HEP.     Baseline  progressing as appropriate    Time  6    Period  Weeks    Status  On-going    Target Date  08/08/17      PT LONG TERM GOAL #3   Title  "Pt will tolerate sitting 90 minutes  without increased pain to ride in car without increased pain    Baseline  30 min max    Time  6    Period  Weeks    Status  On-going    Target Date  08/08/17      PT LONG TERM GOAL #4   Title  "FOTO will improve from  64% limitation  to  43% limitation    indicating improved functional mobility.     Baseline  not taken today    Time  6    Period  Weeks    Status  Unable to assess    Target Date  08/08/17      PT LONG TERM GOAL #5   Title  Pt will be able to return to physical activity and be able to run for 1/2 a mile without exacerbating pain in back    Baseline  has not run due to pain    Time  6    Period  Weeks    Status  On-going    Target Date  08/08/17      PT LONG TERM GOAL #6   Title  "Pt will tolerate walking for 2 hours without increased pain in order to return to PLOF     Baseline  15-20 min    Time  6    Period  Weeks    Status  On-going    Target Date  08/08/17      PT LONG TERM GOAL #7   Title  pt to demonstrate decrease upper trap/ surrounding muscles to decrease pain/ HA and promote cervical mobility     Baseline  pt reports HA still present, being referred to neurologist    Time  6    Period  Weeks    Status  On-going    Target Date  08/08/17      PT LONG TERM GOAL #8   Title  pt to increase cervical mobility by >/= 10 degrees  in all planes  to promote safety with driving and no report of pain/ HA or Tinnitis    Baseline  see flowsheet    Time  6    Period  Weeks    Status  On-going    Target Date  08/08/17            Plan - 08/01/17 3419    Clinical Impression Statement  Pt has chosen to end PT treatment at this time due to lack of progression at this point. He was encouraged to seek further treatment and consider imaging to determine proper progression. Pt did not have any more questions and did not want to go over any exercises. I encouraged him to contact us with any further questions or needs.     PT Treatment/Interventions  ADLs/Self Care Home Management;Taping;Dry needling;Electrical Stimulation;Iontophoresis 10m/ml Dexamethasone;Cryotherapy;Moist Heat;Traction;Ultrasound;Functional mobility training;Therapeutic activities;Therapeutic exercise;Neuromuscular re-education;Patient/family education;Passive range of motion;Manual techniques    PT Home Exercise Plan  child's pose, intial posture sitting and standing. prepilates series clams, tabel top with alternating heel taps, upper trap stretching, levator scap sretch, chin tuck, hamstring stretch, SLR, 1st rib mobs, and thoracic mobility; cat/camel/child pose, R SLS with abdominal engagement; prone R hip ER with glut sets, brige with R clam, stand with R foot slightly back    Consulted and Agree with Plan of Care  Patient       Patient will benefit from skilled therapeutic intervention in order to improve the following deficits and impairments:  Hypomobility,  Pain, Impaired flexibility, Increased fascial restricitons, Decreased range of motion, Decreased strength, Decreased mobility, Decreased activity tolerance, Improper body mechanics, Postural dysfunction  Visit Diagnosis: Chronic right-sided low back pain with right-sided sciatica  Muscle spasm of back  Abnormal posture  Muscle weakness (generalized)  Difficulty in walking, not elsewhere  classified     Problem List Patient Active Problem List   Diagnosis Date Noted  . DDD (degenerative disc disease), lumbosacral 09/27/2016  . Urinary incontinence 10/09/2015  . Erectile dysfunction 10/09/2015   PHYSICAL THERAPY DISCHARGE SUMMARY  Visits from Start of Care: 15  Current functional level related to goals / functional outcomes: See above   Remaining deficits: See above   Education / Equipment: Anatomy of condition, POC, HEP, exercise form/rationale Plan: Patient agrees to discharge.  Patient goals were not met. Patient is being discharged due to lack of progress.  ?????     Knox Cervi C. Melissia Lahman PT, DPT 08/01/17 9:45 AM   [facility] [address] [place], [place], [address] Phone: [phone]   Fax:  [phone]  Name: Andrew Jacobs MRN: [id number] Date of Birth: 1966-07-25

## 2017-08-03 ENCOUNTER — Other Ambulatory Visit: Payer: Self-pay | Admitting: Physician Assistant

## 2017-08-03 DIAGNOSIS — G8929 Other chronic pain: Secondary | ICD-10-CM

## 2017-08-03 DIAGNOSIS — M5442 Lumbago with sciatica, left side: Principal | ICD-10-CM

## 2017-08-03 DIAGNOSIS — M5137 Other intervertebral disc degeneration, lumbosacral region: Secondary | ICD-10-CM

## 2017-08-04 ENCOUNTER — Ambulatory Visit: Payer: 59 | Admitting: Physical Therapy

## 2017-08-11 ENCOUNTER — Encounter: Payer: Self-pay | Admitting: Physical Therapy

## 2017-08-18 ENCOUNTER — Encounter: Payer: Self-pay | Admitting: Physical Therapy

## 2017-08-25 ENCOUNTER — Encounter: Payer: Self-pay | Admitting: Physical Therapy

## 2018-04-17 DIAGNOSIS — M9905 Segmental and somatic dysfunction of pelvic region: Secondary | ICD-10-CM | POA: Diagnosis not present

## 2018-04-17 DIAGNOSIS — M9902 Segmental and somatic dysfunction of thoracic region: Secondary | ICD-10-CM | POA: Diagnosis not present

## 2018-04-17 DIAGNOSIS — M9903 Segmental and somatic dysfunction of lumbar region: Secondary | ICD-10-CM | POA: Diagnosis not present

## 2018-04-22 DIAGNOSIS — M9902 Segmental and somatic dysfunction of thoracic region: Secondary | ICD-10-CM | POA: Diagnosis not present

## 2018-04-22 DIAGNOSIS — M9903 Segmental and somatic dysfunction of lumbar region: Secondary | ICD-10-CM | POA: Diagnosis not present

## 2018-04-22 DIAGNOSIS — M9905 Segmental and somatic dysfunction of pelvic region: Secondary | ICD-10-CM | POA: Diagnosis not present

## 2018-04-30 DIAGNOSIS — M9905 Segmental and somatic dysfunction of pelvic region: Secondary | ICD-10-CM | POA: Diagnosis not present

## 2018-04-30 DIAGNOSIS — M9902 Segmental and somatic dysfunction of thoracic region: Secondary | ICD-10-CM | POA: Diagnosis not present

## 2018-04-30 DIAGNOSIS — M9903 Segmental and somatic dysfunction of lumbar region: Secondary | ICD-10-CM | POA: Diagnosis not present

## 2018-05-11 DIAGNOSIS — M9902 Segmental and somatic dysfunction of thoracic region: Secondary | ICD-10-CM | POA: Diagnosis not present

## 2018-05-11 DIAGNOSIS — M9903 Segmental and somatic dysfunction of lumbar region: Secondary | ICD-10-CM | POA: Diagnosis not present

## 2018-05-11 DIAGNOSIS — M9905 Segmental and somatic dysfunction of pelvic region: Secondary | ICD-10-CM | POA: Diagnosis not present

## 2018-06-01 DIAGNOSIS — M9902 Segmental and somatic dysfunction of thoracic region: Secondary | ICD-10-CM | POA: Diagnosis not present

## 2018-06-01 DIAGNOSIS — M9905 Segmental and somatic dysfunction of pelvic region: Secondary | ICD-10-CM | POA: Diagnosis not present

## 2018-06-01 DIAGNOSIS — M9903 Segmental and somatic dysfunction of lumbar region: Secondary | ICD-10-CM | POA: Diagnosis not present

## 2018-06-08 DIAGNOSIS — M9903 Segmental and somatic dysfunction of lumbar region: Secondary | ICD-10-CM | POA: Diagnosis not present

## 2018-06-08 DIAGNOSIS — M9902 Segmental and somatic dysfunction of thoracic region: Secondary | ICD-10-CM | POA: Diagnosis not present

## 2018-06-08 DIAGNOSIS — M9905 Segmental and somatic dysfunction of pelvic region: Secondary | ICD-10-CM | POA: Diagnosis not present

## 2019-02-12 DIAGNOSIS — Z125 Encounter for screening for malignant neoplasm of prostate: Secondary | ICD-10-CM | POA: Diagnosis not present

## 2019-02-12 DIAGNOSIS — E291 Testicular hypofunction: Secondary | ICD-10-CM | POA: Diagnosis not present

## 2020-10-23 ENCOUNTER — Ambulatory Visit: Payer: 59 | Admitting: Internal Medicine

## 2020-11-23 ENCOUNTER — Ambulatory Visit: Payer: 59 | Admitting: Internal Medicine

## 2020-11-23 DIAGNOSIS — Z0289 Encounter for other administrative examinations: Secondary | ICD-10-CM

## 2021-01-01 ENCOUNTER — Ambulatory Visit: Payer: Self-pay | Admitting: Family Medicine
